# Patient Record
Sex: Female | Born: 1935 | Race: White | Hispanic: No | Smoking: Never smoker
Health system: Southern US, Community
[De-identification: ages and names within clinical notes are randomized; demographics above are authoritative.]

## PROBLEM LIST (undated history)

## (undated) DIAGNOSIS — E119 Type 2 diabetes mellitus without complications: Secondary | ICD-10-CM

## (undated) DIAGNOSIS — R011 Cardiac murmur, unspecified: Secondary | ICD-10-CM

## (undated) DIAGNOSIS — I10 Essential (primary) hypertension: Secondary | ICD-10-CM

## (undated) DIAGNOSIS — M810 Age-related osteoporosis without current pathological fracture: Secondary | ICD-10-CM

## (undated) DIAGNOSIS — H269 Unspecified cataract: Secondary | ICD-10-CM

## (undated) DIAGNOSIS — M1990 Unspecified osteoarthritis, unspecified site: Secondary | ICD-10-CM

## (undated) DIAGNOSIS — I219 Acute myocardial infarction, unspecified: Secondary | ICD-10-CM

## (undated) HISTORY — DX: Type 2 diabetes mellitus without complications: E11.9

## (undated) HISTORY — DX: Cardiac murmur, unspecified: R01.1

## (undated) HISTORY — DX: Age-related osteoporosis without current pathological fracture: M81.0

## (undated) HISTORY — DX: Acute myocardial infarction, unspecified: I21.9

## (undated) HISTORY — PX: EYE SURGERY: SHX253

## (undated) HISTORY — DX: Essential (primary) hypertension: I10

## (undated) HISTORY — PX: APPENDECTOMY: SHX54

## (undated) HISTORY — PX: ABDOMINAL HYSTERECTOMY: SHX81

## (undated) HISTORY — DX: Unspecified cataract: H26.9

---

## 1997-10-13 ENCOUNTER — Other Ambulatory Visit: Admission: RE | Admit: 1997-10-13 | Discharge: 1997-10-13 | Payer: Self-pay | Admitting: *Deleted

## 1997-11-07 ENCOUNTER — Ambulatory Visit (HOSPITAL_COMMUNITY): Admission: RE | Admit: 1997-11-07 | Discharge: 1997-11-07 | Payer: Self-pay | Admitting: *Deleted

## 1997-11-10 ENCOUNTER — Other Ambulatory Visit: Admission: RE | Admit: 1997-11-10 | Discharge: 1997-11-10 | Payer: Self-pay | Admitting: *Deleted

## 1997-12-01 ENCOUNTER — Ambulatory Visit (HOSPITAL_COMMUNITY): Admission: RE | Admit: 1997-12-01 | Discharge: 1997-12-01 | Payer: Self-pay | Admitting: Gastroenterology

## 1998-11-06 ENCOUNTER — Other Ambulatory Visit: Admission: RE | Admit: 1998-11-06 | Discharge: 1998-11-06 | Payer: Self-pay | Admitting: *Deleted

## 1999-07-14 ENCOUNTER — Emergency Department (HOSPITAL_COMMUNITY): Admission: EM | Admit: 1999-07-14 | Discharge: 1999-07-14 | Payer: Self-pay | Admitting: Emergency Medicine

## 1999-07-14 ENCOUNTER — Encounter: Payer: Self-pay | Admitting: Emergency Medicine

## 1999-10-18 ENCOUNTER — Encounter: Admission: RE | Admit: 1999-10-18 | Discharge: 1999-10-18 | Payer: Self-pay | Admitting: *Deleted

## 1999-11-12 ENCOUNTER — Other Ambulatory Visit: Admission: RE | Admit: 1999-11-12 | Discharge: 1999-11-12 | Payer: Self-pay | Admitting: *Deleted

## 1999-12-20 ENCOUNTER — Other Ambulatory Visit: Admission: RE | Admit: 1999-12-20 | Discharge: 1999-12-20 | Payer: Self-pay | Admitting: *Deleted

## 2000-12-08 ENCOUNTER — Encounter: Payer: Self-pay | Admitting: Internal Medicine

## 2000-12-08 ENCOUNTER — Encounter: Admission: RE | Admit: 2000-12-08 | Discharge: 2000-12-08 | Payer: Self-pay | Admitting: Internal Medicine

## 2000-12-23 ENCOUNTER — Other Ambulatory Visit: Admission: RE | Admit: 2000-12-23 | Discharge: 2000-12-23 | Payer: Self-pay | Admitting: Gynecology

## 2001-02-12 ENCOUNTER — Encounter: Payer: Self-pay | Admitting: Obstetrics and Gynecology

## 2001-02-18 ENCOUNTER — Inpatient Hospital Stay (HOSPITAL_COMMUNITY): Admission: RE | Admit: 2001-02-18 | Discharge: 2001-02-21 | Payer: Self-pay | Admitting: Obstetrics and Gynecology

## 2001-02-18 ENCOUNTER — Encounter (INDEPENDENT_AMBULATORY_CARE_PROVIDER_SITE_OTHER): Payer: Self-pay | Admitting: Specialist

## 2001-03-04 ENCOUNTER — Encounter: Admission: RE | Admit: 2001-03-04 | Discharge: 2001-03-04 | Payer: Self-pay | Admitting: *Deleted

## 2001-06-04 ENCOUNTER — Ambulatory Visit (HOSPITAL_COMMUNITY): Admission: RE | Admit: 2001-06-04 | Discharge: 2001-06-04 | Payer: Self-pay | Admitting: *Deleted

## 2002-01-04 ENCOUNTER — Encounter: Admission: RE | Admit: 2002-01-04 | Discharge: 2002-01-04 | Payer: Self-pay | Admitting: Internal Medicine

## 2002-01-07 ENCOUNTER — Encounter: Admission: RE | Admit: 2002-01-07 | Discharge: 2002-01-07 | Payer: Self-pay | Admitting: Internal Medicine

## 2002-03-24 ENCOUNTER — Other Ambulatory Visit: Admission: RE | Admit: 2002-03-24 | Discharge: 2002-03-24 | Payer: Self-pay | Admitting: Obstetrics and Gynecology

## 2002-06-23 ENCOUNTER — Emergency Department (HOSPITAL_COMMUNITY): Admission: EM | Admit: 2002-06-23 | Discharge: 2002-06-23 | Payer: Self-pay | Admitting: Emergency Medicine

## 2003-04-26 ENCOUNTER — Encounter: Admission: RE | Admit: 2003-04-26 | Discharge: 2003-04-26 | Payer: Self-pay | Admitting: Internal Medicine

## 2004-06-06 ENCOUNTER — Ambulatory Visit (HOSPITAL_COMMUNITY): Admission: RE | Admit: 2004-06-06 | Discharge: 2004-06-06 | Payer: Self-pay | Admitting: Internal Medicine

## 2004-06-25 ENCOUNTER — Ambulatory Visit (HOSPITAL_COMMUNITY): Admission: RE | Admit: 2004-06-25 | Discharge: 2004-06-25 | Payer: Self-pay | Admitting: Interventional Radiology

## 2004-06-25 ENCOUNTER — Encounter (INDEPENDENT_AMBULATORY_CARE_PROVIDER_SITE_OTHER): Payer: Self-pay | Admitting: *Deleted

## 2004-07-11 ENCOUNTER — Encounter: Payer: Self-pay | Admitting: Interventional Radiology

## 2005-08-31 IMAGING — XA IR KYPHOPLASTY/VERTERBROPLASTY
1 series · 15 of 24 positions shown · IV contrast (agent unspecified)
Comparison: none

CLINICAL DATA: Patient with severe low back pain secondary to subacute compression fractures at L3 and L5.
KYPHOPLASTY AT L3 AND VERTEBROPLASTY AT L5:
Following a full explanation of the procedure along with the potentially associated complications, an informed witnessed consent was obtained for kyphoplasty at L3 and vertebroplasty at L5.  
KYPHOPLASTY AT L3:
The skin overlying the lumbar region was then prepped and draped in the usual sterile fashion.  The L3 vertebral body was brought into optimal view.
The right pedicle was identified and infiltrated with 0.25 percent bupivacaine.  
Using biplane intermittent fluoroscopy, an 11-gauge Jamshidi needle was advanced through the right pedicle into the posterior one-third at L3.  Over a KyphX osteo bone pin, this was exchanged for the KyphX advanced osteo introducer system comprised of a working cannula and a KyphX osteo drill.  
This combination was then advanced until the tip of the KyphX osteo drill was in the posterior third at L3.  
The bone pin was then removed.  In a medial trajectory, using biplane intermittent fluoroscopy, the combination was advanced until the tip of the working cannula was inside the posterior third at L3. 
The KyphX osteo drill was removed and a core sample sent for pathologic analysis.
Through the working cannula, a KyphX bone biopsy device was then advanced to within 5 mm from the anterior aspect of L3.  A core sample from this was also sent for pathologic analysis. 
Through the working cannula, a KyphX inflatable bone tamp 20 x 3 was then advanced and positioned in the vertebral body with the distal marker 5 mm from the anterior aspect of L3.  This balloon was then inflated with contrast using a Kyphon inflation syringe device via micro tubing. The inflation was continued until the end point of apposition with the superior end plate was met.  Crossing of the midline by the balloon was noted.  The methylmethacrylate mixture was then reconstituted with Tobramycin in the Kyphon bone mixing device system.    This was then loaded onto the KyphX bone fillers.
The balloon was deflated followed by the instillation of 3 bone filler equivalents of methylmethacrylate mixture.  
There was no extravasation noted into the disc spaces or posteriorly into the spinal canal.    The patient tolerated the procedure well. 
The working cannula was the removed, as was the bone filler device.
The patient tolerated the procedure well.  There were no acute complications.

[Series 1: run · 15 of 26 slices shown]
[im 1/26]
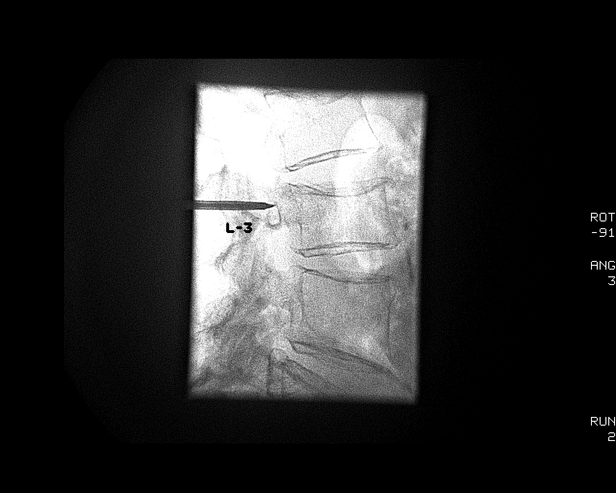
[im 3/26]
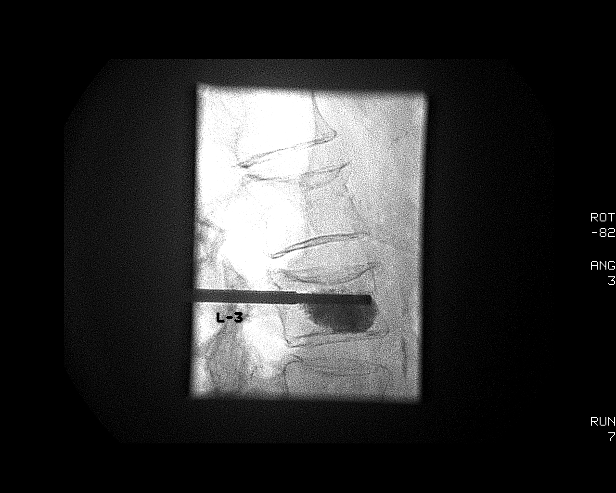
[im 5/26]
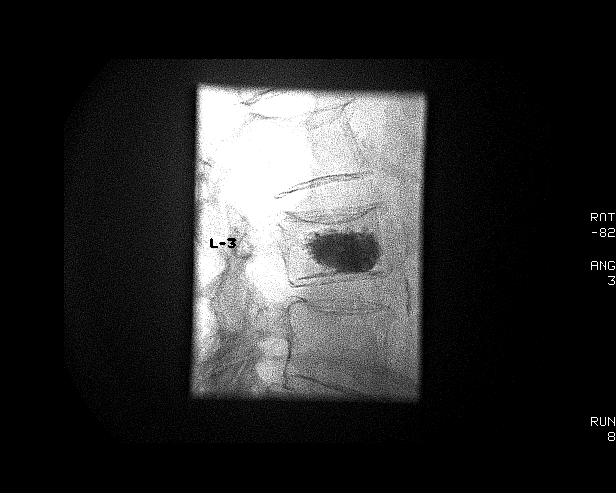
[im 6/26]
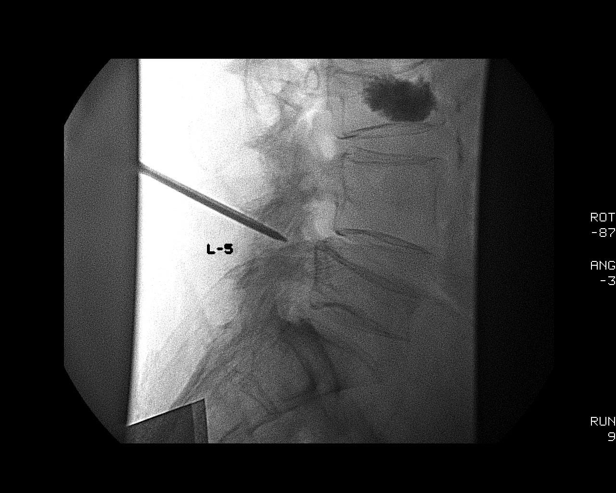
[im 8/26]
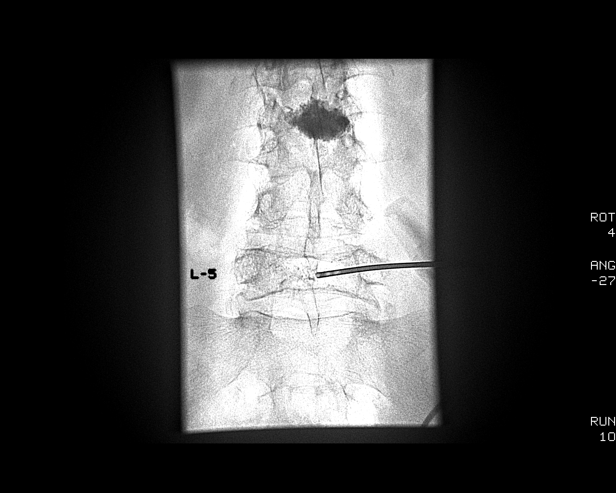
[im 9/26]
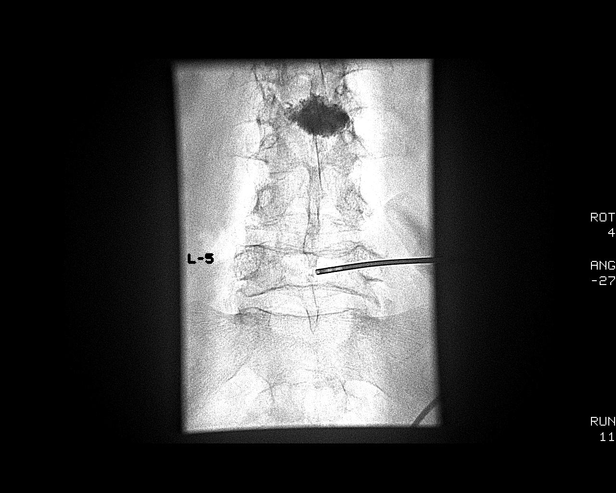
[im 11/26]
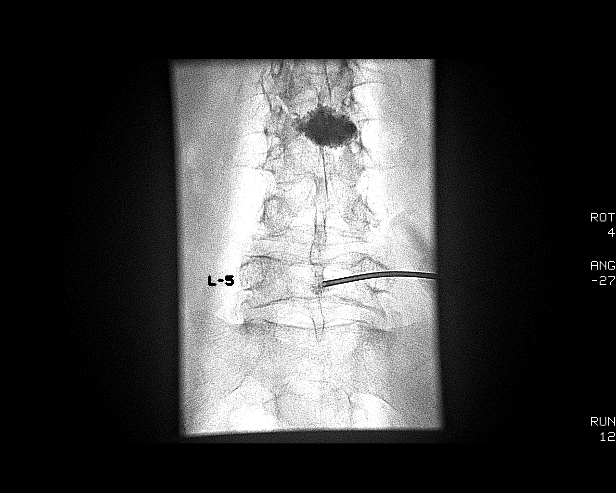
[im 14/26]
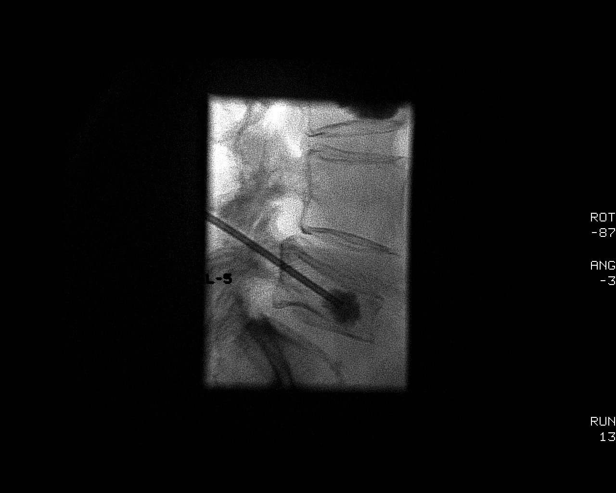
[im 15/26]
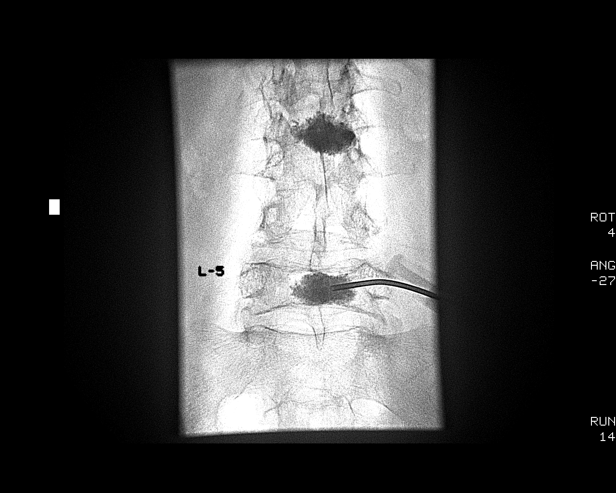
[im 17/26]
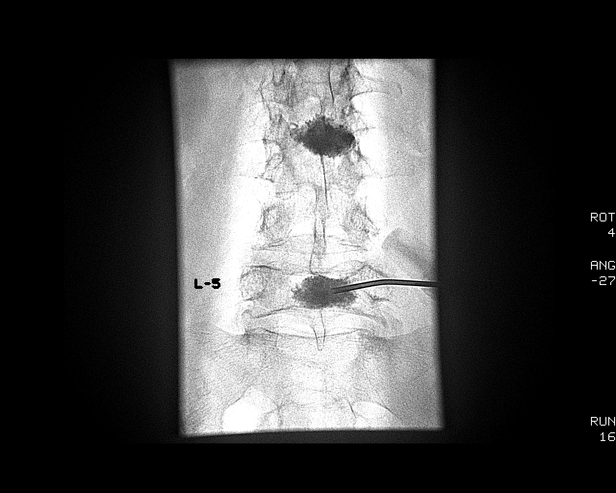
[im 18/26]
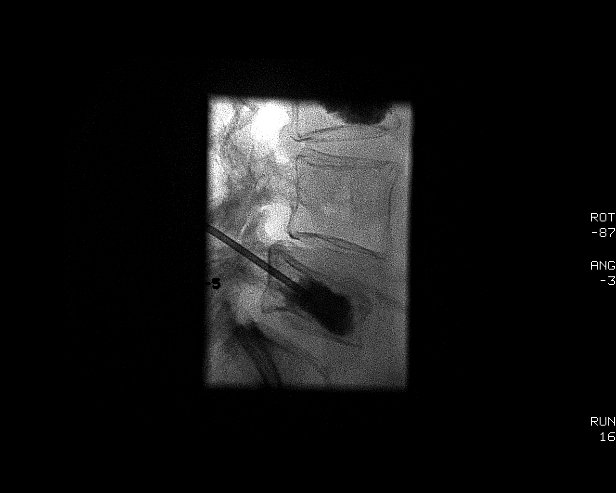
[im 20/26  full-range]
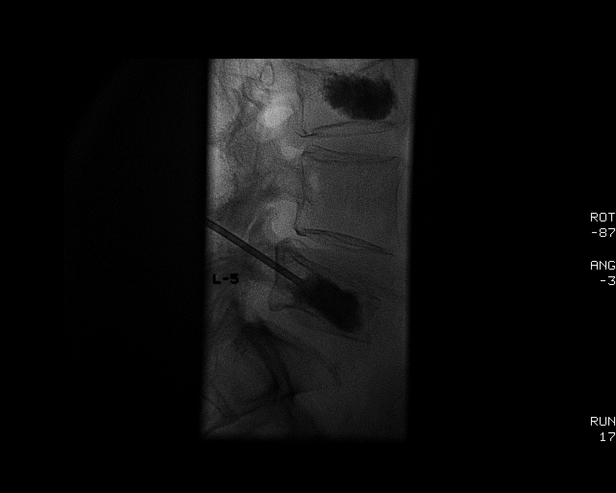
[im 22/26  full-range]
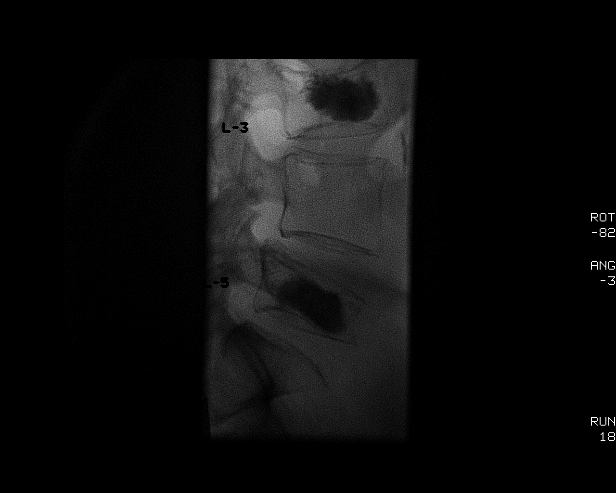
[im 23/26]
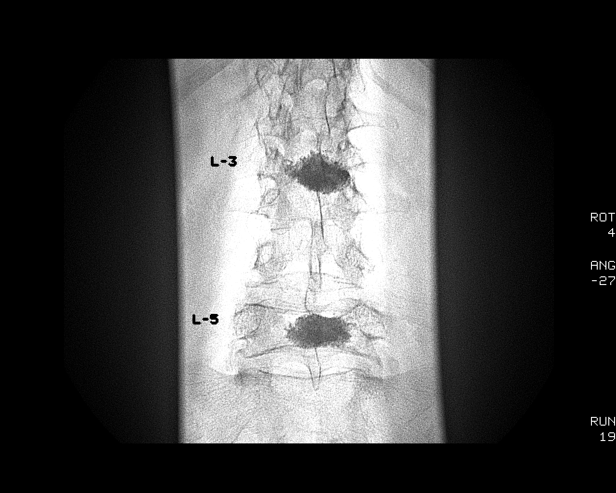
[im 26/26]
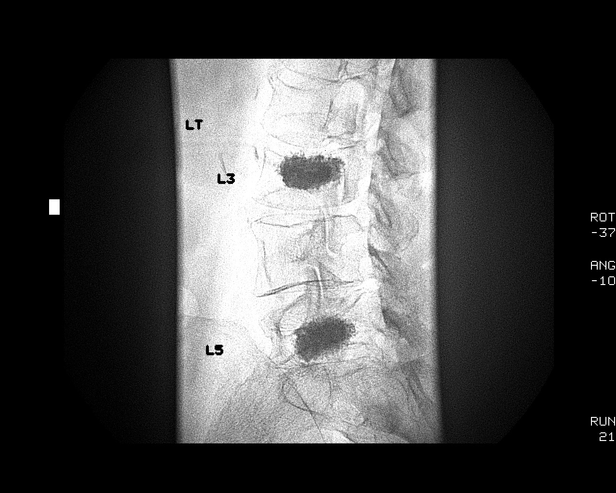

[15 of 24 positions shown; findings below may reference images not displayed]

IMPRESSION: Status post fluoroscopic-guided deep core bone biopsy at L3 followed by closed reduction with internal fixation of painful compression deformity at L3 using the kyphoplasty technique most likely osteoporotic.
VERTEBROPLASTY AT L5:
Initial attempt at performing a kyphoplasty through the right transpedicular approach at L5 was met with significant resistance to the passage of the 11-gauge Jamshidi needle through the pedicle.
After application of local anesthetic to the right pedicle, an 11-gauge Jamshidi needle was advanced through the right pedicle and positioned in the posterior third right next to the pedicle at L5.  Exchange over a bone pin for the Kyphon advanced osteo introducer system comprised of a working cannula and a KyphX drill was met with significant resistance with the patient being symptomatic and also the bone being really hard in its pedicle region.  This prompted removal of the system, although a core was obtained for biopsy.
At this time, a 13-gauge Ronlor spinal needle was advanced through the right pedicle and positioned in the anterior one-third of L5.  A gentle contrast injection demonstrated a trabecular pattern of contrast.
Methylmethacrylate mixture was then reconstituted with Tobramycin in the Benites delivery device system.
Using biplane intermittent fluoroscopy, methylmethacrylate mixture was instilled into the L5 vertebral body with excellent filling in the AP and lateral projections. 
No extravasation was noted into the superior end plate or posteriorly into the spinal canal.   The patient tolerated the procedure well. There were no acute complications.
The needle was removed and hemostasis was achieved.
IMPRESSION: Status post needle placement for vertebroplasty at L5 for painful subacute compression fracture.  
Medications for both procedures:  3.5 mg Versed and 100 micrograms Fentanyl IV.

## 2005-10-07 ENCOUNTER — Emergency Department (HOSPITAL_COMMUNITY): Admission: EM | Admit: 2005-10-07 | Discharge: 2005-10-07 | Payer: Self-pay | Admitting: Emergency Medicine

## 2006-06-11 ENCOUNTER — Encounter: Admission: RE | Admit: 2006-06-11 | Discharge: 2006-06-11 | Payer: Self-pay | Admitting: Internal Medicine

## 2006-06-18 ENCOUNTER — Encounter: Payer: Self-pay | Admitting: Internal Medicine

## 2006-06-20 ENCOUNTER — Encounter (INDEPENDENT_AMBULATORY_CARE_PROVIDER_SITE_OTHER): Payer: Self-pay | Admitting: Specialist

## 2006-06-20 ENCOUNTER — Ambulatory Visit (HOSPITAL_COMMUNITY): Admission: RE | Admit: 2006-06-20 | Discharge: 2006-06-20 | Payer: Self-pay | Admitting: Interventional Radiology

## 2007-07-07 ENCOUNTER — Encounter: Admission: RE | Admit: 2007-07-07 | Discharge: 2007-10-06 | Payer: Self-pay | Admitting: Internal Medicine

## 2007-09-30 ENCOUNTER — Encounter: Admission: RE | Admit: 2007-09-30 | Discharge: 2007-09-30 | Payer: Self-pay | Admitting: Internal Medicine

## 2007-10-07 ENCOUNTER — Encounter: Admission: RE | Admit: 2007-10-07 | Discharge: 2008-01-05 | Payer: Self-pay | Admitting: Internal Medicine

## 2009-02-07 ENCOUNTER — Emergency Department (HOSPITAL_COMMUNITY): Admission: EM | Admit: 2009-02-07 | Discharge: 2009-02-07 | Payer: Self-pay | Admitting: Emergency Medicine

## 2009-02-19 ENCOUNTER — Emergency Department (HOSPITAL_COMMUNITY): Admission: EM | Admit: 2009-02-19 | Discharge: 2009-02-19 | Payer: Self-pay | Admitting: Family Medicine

## 2009-06-03 ENCOUNTER — Emergency Department (HOSPITAL_COMMUNITY): Admission: EM | Admit: 2009-06-03 | Discharge: 2009-06-03 | Payer: Self-pay | Admitting: Emergency Medicine

## 2010-03-13 ENCOUNTER — Encounter
Admission: RE | Admit: 2010-03-13 | Discharge: 2010-05-09 | Payer: Self-pay | Source: Home / Self Care | Attending: Internal Medicine | Admitting: Internal Medicine

## 2010-10-05 NOTE — Consult Note (Signed)
Leslie Delgado, LEIB                ACCOUNT NO.:  [id number]   MEDICAL RECORD NO.:  [id number]          PATIENT TYPE:  OUT   LOCATION:  XRAY                         FACILITY:  [facility]   PHYSICIAN:  Sanjeev K. Deveshwar, M.D.DATE OF BIRTH:  September 10, 1921   DATE OF CONSULTATION:  DATE OF DISCHARGE:                                 CONSULTATION   DATE OF CONSULTATION:  June 18, 2006   CHIEF COMPLAINT:  Compression fracture.   HISTORY OF PRESENT ILLNESS:  This is a pleasant 77 year old female with  a history of osteoporosis and previous history of compression fractures.  The patient has been referred to Dr. Corliss Skains through the courtesy of  Dr. Juline Patch, her primary care physician.  Approximately 3 weeks ago  the patient bent over to pick something up off the floor at which time  she developed severe back pain.  The patient has been taking Vicodin for  pain however she continues to be very debilitated by her pain.  She had  an MRI on June 11, 2006 that showed a T10 fracture with 60% loss of  height.  There was also an N-plate fracture at L1 with a 10-20% loss of  height.  The patient presents today accompanied by her daughter to  discuss further treatment options with Dr. Corliss Skains.   PAST MEDICAL HISTORY:  1. The patient had an L3 kyphoplasty as well as L5 vertebroplasty      performed by Dr. Corliss Skains in February of 2006.  2. She fell and injured her shoulder in early November and this is      being followed by Dr. Simonne Come.  3. She has a history of diabetes mellitus.  4. Hyperlipidemia.  5. Hypertension.  6. Gastroesophageal reflux disease.  7. Seasonal allergies.  8. Migraine headaches.  9. History of a carotid bruits.   PAST SURGICAL HISTORY:  Significant for:  1. Hysterectomy.  2. Tonsillectomy.  3. Previous surgery for detached retina x3 as well as cataract      surgery.   ALLERGIES:  SHE IS ALLERGIC TO PENICILLIN, SULFA AND LOTENSIN.   MEDICATIONS:  1.  Vicodin for pain.  2. Calcium 600 mg daily.  3. Vitamin E 400 mg daily.  4. Multivitamin Feverfew b.i.d.  5. Aciphex 20 mg daily.  6. Glyburide 5/500 one a.m., two p.m.  7. Lasix p.r.n.  8. Zoloft, dosage not available.  9. Lotrel 5/20 one daily.  10.Wellbutrin 150 mg daily.  11.Januvia one daily.  12.Alpha lipoic acid 600 mg daily.  13.Tramadol p.r.n.   SOCIAL HISTORY:  Patient is married, she has 3 children, she lives in  [place] with her husband.  Her husband is disabled due to Parkinson's  disease.  The patient has never used tobacco, she does not use alcohol.  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   FAMILY HISTORY:  Mother died at age 46, she had severe peripheral  vascular disease.  Her father died in his 63s.  He had a long history of  tobacco use and history of CVAs.  IMPRESSION:  Dr. Corliss Skains reviewed the results of the patient's most  recent MRI with the patient and her daughter.  He pointed out the new  cmopression fractures as well as the previously treated compression  fractures.  He felt that she would be a candidate for a kyphoplasty or  vertebroplasty at both levels.  The procedure was again described in  detail along with risks and benefits.  The patient is anxious to proceed  for stabilization of her fractures and relief of pain.  She has been  tentatively scheduled for Friday June 20, 2006, time to be announced.   Greater than 30 minutes has been spent on this consult.      Delton See, P.A.    ______________________________  Grandville Silos. Corliss Skains, M.D.    DR/MEDQ  D:  06/18/2006  T:  06/19/2006  Job:  [id number]   cc:   Juline Patch, M.D.  Marlowe Kays, M.D.

## 2010-10-05 NOTE — Procedures (Signed)
[facility]  Patient:    Leslie Delgado, Leslie Delgado Visit Number: [id number] MRN: [id number]          Service Type: END Location: ENDO Attending Physician:  Sabino Gasser Dictated by:   Sabino Gasser, M.D. Admit Date:  06/04/2001 Discharge Date: 06/04/2001                             Procedure Report  PROCEDURE:  Upper endoscopy.  ENDOSCOPIST:  Sabino Gasser, M.D.  INDICATIONS:  Dysphagia, GERD.  ANESTHESIA:  Demerol 50, Versed 6 mg.  DESCRIPTION OF PROCEDURE:  With the patient mildly sedated in the left lateral decubitus position the Olympus videoscopic endoscope was inserted in the mouth and passed under direct vision through the esophagus which appeared normal into the stomach.  The fundus, body, antrum, duodenal bulb, second portion of the duodenum all appeared normal.  From this point, the endoscope was slowly withdrawn taking circumferential views of the entire duodenal mucosa until the endoscope then pulled back into the stomach, placed on retroflexion and viewed the stomach from below.  It was noted that the GE junction made a very loose wrap around the endoscope.  This was photographed.  The endoscope was then straightened and withdrawn, taking circumferential views of the remaining gastric and esophageal mucosa.  The hiatal hernia was visualized from above, and no Barretts esophagus seen.  The patients vital signs and pulse oximeter remained stable.  The patient tolerated the procedure well without apparent complications.  FINDINGS:  Small hiatal hernia, otherwise unremarkable examination.  PLAN:  Continue Aciphex, at this point, since that patients symptoms have all abated on medication. Dictated by:   Sabino Gasser, M.D. Attending Physician:  Sabino Gasser DD:  06/04/01 TD:  06/06/01 Job: [id number]

## 2010-10-05 NOTE — H&P (Signed)
[facility]  Patient:    Leslie Delgado, Leslie Delgado Visit Number: [id number] MRN: [id number]          Service Type: Attending:  Rande Brunt. Eda Paschal, M.D.                           History and Physical  CHIEF COMPLAINT:              Pelvic mass and symptomatic cystocele.  HISTORY OF PRESENT ILLNESS:   Patient is a 77 year old gravida 3, para 3, AB 0 who was seen in my office on August 06th because of a third degree cystocele. Patients symptoms included the fact that she could feel a large protuberance coming outside of her vagina.  She had urgency incontinence.  She had recurrent urinary tract infection.  She had hesitancy.  She was having trouble completely emptying her bladder.  She has had a workup by Dr. Earlene Plater who did not feel sling procedure would help along with cystocele reduction.  She did have neurourodynamic evaluation.  The other problem was that when she was seen in the office a large pelvic mass of approximately 10 cm was felt.  It was most consistent with a myoma.  Ultrasound revealed a 16.5 x 10.1 x 10.8 cm homogeneous mass in the pelvis.  It is very difficult to be sure whether it is uterine or adnexal.  She had never had anyone feel this mass previously.  As a result of the above she now enters the hospital for exploratory laparotomy with total abdominal hysterectomy and bilateral salpingo-oophorectomy. Assuming that the mass is benign we will then proceed with an anterior and possibly posterior repair for correction of the rest of her symptomatic pelvic relaxation.  She also has uterine prolapse in spite of this large mass.  If she has a malignancy Dr. Stanford Breed is on standby to do appropriate other procedures.  PAST MEDICAL HISTORY:         Patient has a history of diabetes, hypertension, arthritis, bronchitis, migraine headaches, and hypercholesterolemia.  She was recently evaluated by Lilia Pro who felt that she was low risk for  surgery and gave permission for her to proceed with surgery.  PRESENT MEDICATIONS:          Include a baby aspirin, which she stopped prior to surgery, multivitamins, calcium, Diabeta 5 mg b.i.d., Glucophage 500 mg t.i.d., Norvasc 10 mg daily, Lasix 40 mg on a p.r.n. basis for edema, Urised 1 or 2 tablets p.r.n. bladder irritability, niacin 250 mg daily, Endal HD p.r.n. cough, __________ everyday for migraine prophylaxis, Aleve p.r.n. for acute migraine, Claritin 10 mg daily, and Zoloft 100 mg tablet half a tablet daily.   ALLERGIES:                    She is allergic to LOTENSIN, PENICILLIN, CODEINE and SULFA.  FAMILY HISTORY:               Father died at age 23 with heart disease and a stroke.  Mother died at age 30 with ASPVD requiring an amputation and also had a history of cancerous colon polyp.  Patient has two brothers; one brother had an angioplasty as well as bilateral carotid endarterectomies.  There is also a family history of CAD, glaucoma and high cholesterol.  The other brother is in good health.  There is also a positive family history for stomach cancer in an uncle and breast cancer in  two aunts and a couple cousins.  His of GI cancer in an aunt, multiple colon polyps in her brother and diabetes in an uncle.  SOCIAL HISTORY:               Patient is married with three children who are in good health.  She does not work.  Her husband is retired.  She occasionally exercises.  She has never been a smoker and does not drink alcohol.  REVIEW OF SYSTEMS:            Except for problems discussed above the patient reports no other complaints referable to head, eyes, ears, nose, throat, neck, chest, heart, lungs, breasts, GI tract, GU tract, musculoskeletal system, neurological system, endocrine system, and skin.  PHYSICAL EXAMINATION:  GENERAL APPEARANCE:           Patient is a well-developed, well-nourished female in no acute distress.  VITAL SIGNS:                   Blood pressure is 118/62, pulse is 80 and regular, respirations 16 and nonlabored, and she is afebrile.  HEENT:                        Within normal limits.  NECK:                         Supple.  Trachea in the midline.  Thyroid is not enlarged.  LUNGS:                        Clear to P&A.  BREASTS:                      No masses.  HEART:                        No thrills, heaves or murmurs.  ABDOMEN:                      Soft without guarding, rebound or masses.  PELVIC:                       External genitalia is normal.  BUS is normal. Vaginal examination shows an exceedingly large third degree cystocele.  Cervix is clean.  Pap smear shows no atypia.  The patient has second degree uterine descensus, but also has a large 10-16 cm pelvic mass, which is felt to probably be a myoma.  Adnexa difficult to assess because of large mass. Rectovaginal is confirmatory.  EXTREMITIES:                  Within normal limits.  ADMISSION IMPRESSION:         1. Symptomatic cystocele.                               2. Uterine prolapse.                               3. Large pelvic mass.                               4. Diabetes.  5. Hypertension.                               6. Arthritis.                               7. Hypercholesterolemia.  PLAN:                         Exploratory laparotomy with TAH-BSO; other abdominal surgery as appropriate followed by anterior and probable posterior repair. Attending:  Rande Brunt. Eda Paschal, M.D. DD:  02/17/01 TD:  02/17/01 Job: [id number]

## 2010-10-05 NOTE — Discharge Summary (Signed)
[facility]  Patient:    WRENLEY, SAYED Visit Number: [id number] MRN: [id number]          Service Type: GYN Location: 4W 0444 01 Attending Physician:  Sharon Mt Dictated by:   Rande Brunt. Eda Paschal, M.D. Admit Date:  02/18/2001 Discharge Date: 02/21/2001                             Discharge Summary  HISTORY OF PRESENT ILLNESS/HOSPITAL COURSE:  Patient is a 77 year old female who was admitted to the hospital with a large pelvic mass as well as uterine prolapse, cystocele and rectocele for definitive surgery.  On the day of admission, she was taken to the operating room.  Exploratory laparotomy was done, findings were a large left ovarian cyst, and the patient first underwent a total abdominal hysterectomy and bilateral salpingo-oophorectomy.  Frozen section done on the ovarian cyst came back a benign ovarian fibroma as well as a benign serous cystadenoma.  The lesion clearly had two different components, both a solid and a cystic, to explain the above.  There was also a cyst on her right ovary and a frozen on that also was benign.  As a result of a benign report after the TAH-BSO was accomplished, the patient was repositioned and an anterior and posterior repair was done because of her cystocele and rectocele. Postoperatively, the patient continued to do well.  She, however, had a mild ileus that responded both to IV fluids and ambulation.  She also found that, when she took her Foley out, she was unable to void.  By the third postoperative day, however, she was ready for discharge in terms of the ileus and she was discharged with her Foley in place, to return to the office to have her Foley removed on October 9.  She was discharged on Macrobid b.i.d. for GU suppression.  She was also discharged on Darvocet-N 100 for pain.  Diet on discharge was regular.  Activity on discharge was ambulatory.  Wound care was routine, with her to return  to the office on October 9 for staple removal.  Condition on discharge was improved.  Final pathology report revealed left ovary and fallopian tube with benign ovarian fibroma and benign serous cystadenoma, right ovary and fallopian tube with benign serous cystadenoma, uterus with leiomyoma uteri, both submucosal and intramural, endometrium with cystic atrophy with focal simple hyperplasia without atypia.  DISCHARGE DIAGNOSES: 1. Bilateral serous cystadenomas of the ovaries. 2. Benign left ovarian fibroma. 3. Leiomyoma uteri. 4. Uterine prolapse. 5. Cystocele and rectocele.  OPERATIONS:  Total abdominal hysterectomy and bilateral salpingo-oophorectomy, anterior and posterior repair. Dictated by:   Rande Brunt. Eda Paschal, M.D. Attending Physician:  Sharon Mt DD:  03/13/01 TD:  03/14/01 Job: [id number]

## 2010-10-05 NOTE — Op Note (Signed)
[facility]  Patient:    Leslie Delgado, Leslie Delgado Visit Number: [id number] MRN: [id number]          Service Type: GYN Location: 4W 0444 01 Attending Physician:  Sharon Mt Dictated by:   Rande Brunt. Eda Paschal, M.D. Proc. Date: 02/18/01 Admit Date:  02/18/2001                             Operative Report  PREOPERATIVE DIAGNOSIS:  Large pelvic mass, cystocele, rectocele, and uterine prolapse.  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  OPERATION:  Total abdominal hysterectomy and bilateral salpingo-oophorectomy followed by anterior and posterior repair.  SURGEON:  Daniel L. Eda Paschal, M.D.  FIRST ASSISTANT:  Katy Fitch, M.D.  ANESTHESIA:  General endotracheal.  FINDINGS:  At the time of laparotomy, the patients left ovary was exceedingly large, in the neighborhood of about 16 cm.  It was enlarged by ovarian neoplasm.  The ovarian neoplasm had been solid and cystic components.  At frozen section, the solid component was a benign fibroma, and the cystic component was either a serous cystadenoma or a very old endometrioma.  It was somewhat adherent both to the uterus and to the cul-de-sac.  It had blebs on the surface of it, but they were not true excrescences.  On her right ovary, there was a small, less than 1 cm lesion coming off of it, and then on frozen section was benign.  The uterus itself was normal size and shape.  The patient had a 30 degree cystocele and a second degree rectocele as well as second degree uterine descensus.  DESCRIPTION OF PROCEDURE:  After adequate general endotracheal anesthesia, the patient was placed in [place] stirrups, prepped and draped in the usual sterile manner.  A midline vertical incision was made.  It was then extended around the umbilicus for further exposure.  It was carried down to the fascia and the peritoneum.  Subcutaneous bleeders were  clamped and Bovied as encountered. When the peritoneal cavity was opened, the above findings were noted. Peritoneal washings were obtained.  After this, the left adnexal mass was carefully dissected free.  The retroperitoneal space could be opened.  The ureter could be identified.  The infundibulopelvic ligament was clamped, cut, and doubly suture ligated with #1 chromic catgut.  The tumor could be separated from the uterus.  It could then be separated from the peritoneum where it was adherent by sharp and blunt dissection.  All this was done without rupturing the cyst.  It was sent for frozen section and came back benign.  It was a benign fibroma as well as a benign serous cystadenoma.  The left ovary also was sent for frozen and came back benign.  The left ovary was removed by bipolaring and cutting the round ligament, opening the peritoneum, identifying the ureter, and then clamping, cutting, and double suture ligating the infundibulopelvic ligament.  After the adnexa had been removed, the surgeon went ahead and did a hysterectomy.  The vesicouterine fold of the peritoneum and posterior peritoneum were sharply dissected free.  The uterine arteries were clamped, cut, and doubly suture ligated with #1 chromic catgut, and then the parametrium was taken down in successive bites by clamping, cutting, and suture ligating with #1 chromic catgut.  The cervicovaginal junction was identified, entered with sharp dissection, and the uterus was also sent to pathology for tissue diagnosis.  Angle sutures were  placed in the angles of the vagina, incorporating the uterosacral ligaments of the parametrium for good vault support.  These were #1 chromic catgut.  The vaginal cuff was closed with figure-of-eights of 0 Vicryl.  A modified Moschcowitz enterocele prevention suture was placed, keeping the ureters identified the entire time. This suture at the sigmoid colon into the area between the  uterosacral ligaments, and would prevent an enterocele in the future.  Copious irrigation was done with Ringers lactate.  The sponge, needle, and instrument counts were correct.  The peritoneum and the fascia was closed in a single layer using a double band of 0 PDS.  One was started above the umbilicus and was started in the pelvis, and they met and were tied together in the midline.  Copious irrigation was done with Ringers lactate and the skin was closed with staples.  The patient was then repositioned.  Her very large cystocele that had been seen at the beginning of the procedure was still present, but it obviously looked much different without ________ of the 16 cm mass.  Now, it appeared almost as if there was a lot of redundant vaginal tissue more than a cystocele.  Starting at the cuff, dissection was taken all the way to the top of the urethrovesical angle.  Her Foley catheter which had been placed at the beginning of the procedure was kept in place.  The perivesical fascia was sharply dissected free.  There really was not much of a cystocele, as much of it was just a lot of redundant vaginal mucosa.  The vesicle fascia was brought together in the midline to eliminate whatever cystocele was present with interrupted 2-0 Vicryl.  Eight to 10 sutures of this were done.  Following this, the anterior vaginal mucosa was closed with a running 2-0 Vicryl, incorporating the tissue underneath to eliminate any dead space. At this point, the patient had no cystocele.  Attention was then turned to the posterior repair.  The posterior vaginal mucosa was undermined from the introitus all the way to the top of the cuff. The patient did have a very large rectocele.  It was bluntly and sharply dissected free from the vaginal mucosa.  The patient had good perirectal fascia.  This was brought together in the midline to eliminate the rectocele with interrupted 2-0 Vicryl.  Redundant posterior  vaginal mucosa was trimmed away, and then the posterior vaginal mucosa was then closed with a running locking 2-0 Vicryl, once again bringing up the perirectal fascia to eliminate  any dead space.  At this point, the patient had an excellent result.  One inch Iodoform was then placed in vagina to pack the vagina.  Estimated blood loss for the entire procedure was 350 cc with none replaced.  The patient left the operating room in satisfactory condition, draining clear urine from her Foley catheter. Dictated by:   Rande Brunt. Eda Paschal, M.D. Attending Physician:  Sharon Mt DD:  02/18/01 TD:  02/19/01 Job: [id number]

## 2010-11-04 ENCOUNTER — Observation Stay (HOSPITAL_COMMUNITY)
Admission: EM | Admit: 2010-11-04 | Discharge: 2010-11-05 | Disposition: A | Discharge status: Home / Self Care | Payer: Medicare Other | Attending: Internal Medicine | Admitting: Internal Medicine

## 2010-11-04 ENCOUNTER — Emergency Department (HOSPITAL_COMMUNITY): Payer: Medicare Other

## 2010-11-04 DIAGNOSIS — E119 Type 2 diabetes mellitus without complications: Secondary | ICD-10-CM | POA: Insufficient documentation

## 2010-11-04 DIAGNOSIS — R209 Unspecified disturbances of skin sensation: Secondary | ICD-10-CM | POA: Insufficient documentation

## 2010-11-04 DIAGNOSIS — Z79899 Other long term (current) drug therapy: Secondary | ICD-10-CM | POA: Insufficient documentation

## 2010-11-04 DIAGNOSIS — R0789 Other chest pain: Principal | ICD-10-CM | POA: Insufficient documentation

## 2010-11-04 DIAGNOSIS — I10 Essential (primary) hypertension: Secondary | ICD-10-CM | POA: Insufficient documentation

## 2010-11-04 LAB — DIFFERENTIAL
Basophils Absolute: 0 10*3/uL (ref 0.0–0.1)
Lymphocytes Relative: 21 % (ref 12–46)
Lymphs Abs: 1.6 10*3/uL (ref 0.7–4.0)
Neutro Abs: 5.1 10*3/uL (ref 1.7–7.7)

## 2010-11-04 LAB — CBC
HCT: 36.5 % (ref 36.0–46.0)
Hemoglobin: 12.3 g/dL (ref 12.0–15.0)
RBC: 4.15 MIL/uL (ref 3.87–5.11)
WBC: 7.7 10*3/uL (ref 4.0–10.5)

## 2010-11-04 LAB — POCT I-STAT, CHEM 8
Chloride: 105 mEq/L (ref 96–112)
Creatinine, Ser: 1.1 mg/dL (ref 0.50–1.10)
HCT: 37 % (ref 36.0–46.0)
Hemoglobin: 12.6 g/dL (ref 12.0–15.0)
Potassium: 4.8 mEq/L (ref 3.5–5.1)
Sodium: 136 mEq/L (ref 135–145)

## 2010-11-04 LAB — CARDIAC PANEL(CRET KIN+CKTOT+MB+TROPI)
CK, MB: 1.9 ng/mL (ref 0.3–4.0)
Total CK: 60 U/L (ref 7–177)

## 2010-11-04 LAB — TROPONIN I: Troponin I: <0.3 ng/mL (ref <0.30)

## 2010-11-04 LAB — CK TOTAL AND CKMB (NOT AT ARMC)
CK, MB: 1.9 ng/mL (ref 0.3–4.0)
Relative Index: INVALID (ref 0.0–2.5)

## 2010-11-05 LAB — CARDIAC PANEL(CRET KIN+CKTOT+MB+TROPI): CK, MB: 2.1 ng/mL (ref 0.3–4.0)

## 2010-11-05 LAB — BASIC METABOLIC PANEL
CO2: 26 mEq/L (ref 19–32)
Chloride: 100 mEq/L (ref 96–112)
GFR calc non Af Amer: 41 mL/min — ABNORMAL LOW (ref >60)
Glucose, Bld: 107 mg/dL — ABNORMAL HIGH (ref 70–99)
Potassium: 3.9 mEq/L (ref 3.5–5.1)
Sodium: 136 mEq/L (ref 135–145)

## 2010-11-05 LAB — GLUCOSE, CAPILLARY
Glucose-Capillary: 118 mg/dL — ABNORMAL HIGH (ref 70–99)
Glucose-Capillary: 175 mg/dL — ABNORMAL HIGH (ref 70–99)

## 2010-12-06 NOTE — H&P (Signed)
NAMEMARIEA, Leslie Delgado                ACCOUNT NO.:  [id number]  MEDICAL RECORD NO.:  [id number]  LOCATION:  2010                         FACILITY:  [facility]  PHYSICIAN:  Calvert Cantor, M.D.     DATE OF BIRTH:  1921/10/12  DATE OF ADMISSION:  11/04/2010 DATE OF DISCHARGE:  11/05/2010                             HISTORY & PHYSICAL   PRIMARY CARE PHYSICIAN:  Juline Patch, MD  CARDIOLOGIST:  Pamella Pert, MD  PRESENTING COMPLAINT:  Chest pressure.  HISTORY OF PRESENT ILLNESS:  This is an 77 year old female with diabetes and hypertension who complains of "funny feeling" in the center of her chest which occurred today.  When asked about more detail, she said maybe it was an ache or pressure.  It lasted only about a minute, but then she noticed it was tingling in her left hand.  This lasted for a good 2-3 hours.  The patient presents to the ER for these above complaints.  The patient states that she recently had a stress test performed in Dr. Verl Dicker office.  She saw him in the office 2 days ago and was told that she should start Plavix.  She has not yet filled this prescription and therefore not started it.  PAST MEDICAL HISTORY: 1. Positive stress test 2 weeks ago. 2. Hypertension. 3. Diabetes mellitus.  PAST SURGICAL HISTORY:  Tonsillectomy.  SOCIAL HISTORY:  She does not smoke or drink.  No history of drug use.  ALLERGIES: 1. sulfa. 2. penicillin.  HOME MEDICATIONS:  Med rec is not yet available.  She is on the following: 1. Actos. 2. Cymbalta. 3. Carvedilol. 4. Glyburide. 5. Januvia. 6. Pravastatin. 7. Baby aspirin daily.  REVIEW OF SYSTEMS:  No recent weight loss or weight gain.  No fatigue. No fever, chills or sweats.  No frequent headaches.  HEENT:  No blurred vision, double vision, sore throat, sinus trouble, or earache. Respiratory:  No cough or shortness of breath.  CARDIAC:  Chest pain mentioned in H and P.  No palpitations or pedal edema.  GI:  No  nausea, vomiting, diarrhea or constipation.  GU: No dysuria or hematuria. HEMATOLOGIC:  No easy bruising.  SKIN:  No rash.  MUSCULOSKELETAL:  Has occasional joint pain and back pain.  NEUROLOGIC:  No focal numbness or weakness.  No history of stroke.  PSYCHOLOGIC:  No anxiety or depression.  PHYSICAL EXAMINATION:  GENERAL:  Elderly female, sitting up in bed in no acute distress. VITAL SIGNS:  Blood pressure 119/65, pulse 89, respiratory rate 16, pulse ox is 98% on room air. HEENT:  Pupils equal, round, reacting to light.  Extraocular movements are intact.  Conjunctivae are pink.  No scleral icterus.  Oral mucosa is moist. NECK:  Supple.  No thyromegaly, lymphadenopathy, carotid bruits. HEART:  Regular rate and rhythm.  No murmurs, rubs, or gallops. LUNGS: Clear bilaterally.  Normal respiratory effort.  No use of accessory muscles. ABDOMEN:  Soft, nontender, nondistended.  Bowel sounds positive.  No organomegaly. EXTREMITIES:  No cyanosis, clubbing or edema.  Pedal pulses positive. NEUROLOGIC:  Cranial nerves II-XII intact.  Strength intact in all 4 extremities. PSYCHOLOGIC:  Awake, alert, oriented x3.  Mood and  affect normal. SKIN:  Warm and dry.  No rash or bruising.  BLOOD WORK:  Metabolic panel reveals a BUN of 31 and creatinine of 1.10, and a glucose of 182.  Rest of her metabolic panel is normal.  CBC is essentially normal.  First set of cardiac biomarkers are negative.  Chest x-ray reveals mild scarring versus atelectasis at the right lung base.  EKG reveals sinus rhythm with first-degree AV block and left anterior fascicular block.  ASSESSMENT/PLAN: 1. Chest pressure of left hand numbness.  We will do 3 sets of cardiac     enzymes to rule her out for an myocardial infarction.  If they do     come back positive, then will need to contact her cardiologist to     see if he would like to do a cardiac cath.  If they are negative, I     will go ahead and notify Dr. Jacinto Halim  that she was in the hospital for     this issue and see if he wants outpatient followup versus coming to     see here in the hospital.  I have gone ahead and started her Plavix     in addition to her aspirin. 2. Hypertension. 3. Diabetes mellitus.  The patient would like to be a full code.  Time on admission was 50 minutes.     Calvert Cantor, M.D.     SR/MEDQ  D:  11/05/2010  T:  11/06/2010  Job:  [id number]  cc:   Juline Patch, M.D.  Electronically Signed by Calvert Cantor M.D. on 12/06/2010 12:12:53 PM

## 2010-12-06 NOTE — Discharge Summary (Signed)
  NAMEJOAQUINA, Leslie Delgado                ACCOUNT NO.:  [id number]  MEDICAL RECORD NO.:  [id number]  LOCATION:  2010                         FACILITY:  [facility]  PHYSICIAN:  Calvert Cantor, M.D.     DATE OF BIRTH:  01-20-1922  DATE OF ADMISSION:  11/04/2010 DATE OF DISCHARGE:  11/05/2010                              DISCHARGE SUMMARY   PRESENTING COMPLAINT:  Chest pressure.  HISTORY OF PRESENT ILLNESS:  This is an 77 year old female who was recently seen by Dr. Jacinto Halim in the office.  She had a positive stress test and was told to start taking Plavix.  She has not yet gotten the prescription for Plavix filled.  The patient developed pressure in the center of her chest for which she was admitted.  She states that it lasted for less than a minute, but what she also noticed was that she had numbness and tingling in her left hand afterwards, which lasted about 2-3 hours.  She was admitted to be ruled out for an MI and did rule out with three sets of cardiac enzymes.  I did speak with Dr. Jacinto Halim who at this point is recommending that she take nitroglycerin as needed for chest pain.  He currently is a little hesitant to do a cardiac cath and wants to attempt medical management.  He placed her on a statin as well.  She is also taking aspirin.  Her creatinine is elevated at 1.25 which would also put her at risk when doing a cardiac cath.  The patient is now being discharged.  She will follow up with Dr. Jacinto Halim in 2-3 days.  He states he will be calling her.  PHYSICAL EXAM:  LUNGS:  Clear bilaterally. HEART:  Regular rate and rhythm.  No murmurs. ABDOMEN:  Soft, nontender, nondistended.  Bowel sounds positive. EXTREMITIES:  No cyanosis, clubbing or edema.  CONDITION ON DISCHARGE:  Stable.  Follow up with primary care physician and Dr. Jacinto Halim later this week.     Calvert Cantor, M.D.     SR/MEDQ  D:  11/05/2010  T:  11/06/2010  Job:  [id number]  Electronically Signed by Calvert Cantor M.D. on  12/06/2010 12:12:43 PM

## 2012-07-14 ENCOUNTER — Ambulatory Visit: Payer: Medicare Other

## 2012-07-14 ENCOUNTER — Ambulatory Visit (INDEPENDENT_AMBULATORY_CARE_PROVIDER_SITE_OTHER): Payer: Medicare Other | Admitting: Family Medicine

## 2012-07-14 VITALS — BP 136/68 | HR 118 | Temp 98.5°F | Resp 18 | Ht 63.0 in | Wt 180.6 lb

## 2012-07-14 DIAGNOSIS — S81801A Unspecified open wound, right lower leg, initial encounter: Secondary | ICD-10-CM

## 2012-07-14 DIAGNOSIS — Z23 Encounter for immunization: Secondary | ICD-10-CM

## 2012-07-14 DIAGNOSIS — S91009A Unspecified open wound, unspecified ankle, initial encounter: Secondary | ICD-10-CM

## 2012-07-14 MED ORDER — DOXYCYCLINE HYCLATE 100 MG PO TABS: 100.0000 mg | ORAL_TABLET | Freq: Two times a day (BID) | ORAL | Status: DC

## 2012-07-14 NOTE — Progress Notes (Signed)
  Subjective:    Patient ID: Leslie Delgado, female    DOB: [birth year], 79 y.o.   MRN: [id number]  HPI Leslie Delgado is a 79 y.o. female New pt to office - being seen today after injury to shin. 2 nights ago - fell and scraped on some steps,  Lost footing on steps, thinks hit R shin on front of corner of step. Still oozing some blood.    Last tetanus vaccination unknown - possibly over 10 years.   Takes plavix and ASA  - for possible CAD.   PCP: Juline Patch.   Review of Systems  Constitutional: Negative for fever and chills.  Skin: Positive for wound (R leg, with oozing of blood. ).       Objective:   Physical Exam  Vitals reviewed. Constitutional: She is oriented to person, place, and time. She appears well-developed and well-nourished. No distress.  Pulmonary/Chest: Effort normal.  Musculoskeletal:       Right knee: She exhibits normal range of motion and no swelling. No tenderness found.       Right ankle: She exhibits swelling and ecchymosis (dependent echymosis from lower leg. no bony ttp or pain in R ankle. ).  Neurological: She is alert and oriented to person, place, and time.  nvi distally.   Skin: Skin is warm and dry.     Psychiatric: She has a normal mood and affect. Her behavior is normal.  [facility] reading (PRIMARY) by  Dr. Neva Seat: R tib-fib: no fx identified.      Assessment & Plan:  Leslie Delgado is a 79 y.o. female Wound of right leg, initial encounter - Plan: DG Tibia/Fibula Right, Td vaccine greater than or equal to 7yo preservative free IM  R leg - now almost 48 hours from time of injury with central deeper component than skin tear. Tetanus updated, will start doxycycline BID, refer to wound care tomorrow at 8am for eval. Start with bactroban topically, telfa, kerlix wrap after cleansed with soap and water in office. No current signs of bleeding or infection.  Patient Instructions  Your tetanus was updated today, and due to the time since the injury will  need to have wound care assistance in best approach for helping this area to heal.  Keep current bandage in place until that evaluation. Start the antibiotic - twice per day to help prevent against infection.  Return to the clinic or go to the nearest emergency room if any of your symptoms worsen or new symptoms occur.

## 2012-07-14 NOTE — Patient Instructions (Addendum)
Your tetanus was updated today, and due to the time since the injury will need to have wound care assistance in best approach for helping this area to heal.  Keep current bandage in place until that evaluation. Start the antibiotic - twice per day to help prevent against infection.  Return to the clinic or go to the nearest emergency room if any of your symptoms worsen or new symptoms occur.

## 2012-07-15 ENCOUNTER — Encounter (HOSPITAL_BASED_OUTPATIENT_CLINIC_OR_DEPARTMENT_OTHER): Payer: Medicare Other | Attending: General Surgery

## 2012-07-15 DIAGNOSIS — I252 Old myocardial infarction: Secondary | ICD-10-CM | POA: Insufficient documentation

## 2012-07-15 DIAGNOSIS — Z9089 Acquired absence of other organs: Secondary | ICD-10-CM | POA: Insufficient documentation

## 2012-07-15 DIAGNOSIS — S81809A Unspecified open wound, unspecified lower leg, initial encounter: Secondary | ICD-10-CM | POA: Insufficient documentation

## 2012-07-15 DIAGNOSIS — W19XXXA Unspecified fall, initial encounter: Secondary | ICD-10-CM | POA: Insufficient documentation

## 2012-07-15 DIAGNOSIS — I2510 Atherosclerotic heart disease of native coronary artery without angina pectoris: Secondary | ICD-10-CM | POA: Insufficient documentation

## 2012-07-15 DIAGNOSIS — E119 Type 2 diabetes mellitus without complications: Secondary | ICD-10-CM | POA: Insufficient documentation

## 2012-07-15 DIAGNOSIS — Z79899 Other long term (current) drug therapy: Secondary | ICD-10-CM | POA: Insufficient documentation

## 2012-07-15 DIAGNOSIS — Z90710 Acquired absence of both cervix and uterus: Secondary | ICD-10-CM | POA: Insufficient documentation

## 2012-07-15 DIAGNOSIS — Z7902 Long term (current) use of antithrombotics/antiplatelets: Secondary | ICD-10-CM | POA: Insufficient documentation

## 2012-07-15 DIAGNOSIS — S81009A Unspecified open wound, unspecified knee, initial encounter: Secondary | ICD-10-CM | POA: Insufficient documentation

## 2012-07-15 DIAGNOSIS — I87309 Chronic venous hypertension (idiopathic) without complications of unspecified lower extremity: Secondary | ICD-10-CM | POA: Insufficient documentation

## 2012-07-15 DIAGNOSIS — I10 Essential (primary) hypertension: Secondary | ICD-10-CM | POA: Insufficient documentation

## 2012-07-15 LAB — GLUCOSE, CAPILLARY: Glucose-Capillary: 305 mg/dL — ABNORMAL HIGH (ref 70–99)

## 2012-07-16 NOTE — Progress Notes (Signed)
[facility]  NAME:  Leslie Delgado, UNGAR                ACCOUNT NO.:  [id number]  MEDICAL RECORD NO.:  [id number]      DATE OF BIRTH:  1921-07-05  PHYSICIAN:  Ardath Sax, M.D.           VISIT DATE:                                  OFFICE VISIT   This is an 79 year old lady who acts very much younger than that.  She comes here because she fell and she has a laceration avulsion injury to the anterior aspect of her right leg.  It is about 7 x 4 cm.  It just peeled the skin right off the anterior leg.  She is a diabetic.  She has had a heart attack before, but as I mentioned, she is very alert and active.  She has had an appendectomy and a hysterectomy.  She takes oral medication for her diabetes.  She also is on carvedilol for mild hypertension.  She is also on Plavix and Lasix and Cymbalta.  I washed the wound and debrided it a little bit, and I am going to treat it with some compression as she does have some venous hypertension, and I put silver alginate on it.  This may be a leg that I would want to try to put a biologic skin substitute, perhaps an Apligraf would be perfect, but we will clean it up first and we will go from there.  She will be back in a week.  DIAGNOSES:  Traumatic laceration anterior aspect of the right leg with avulsion of tissue, venous hypertension, history of coronary artery disease, and a history of type 2 diabetes, and hypertension.     Ardath Sax, M.D.     PP/MEDQ  D:  07/15/2012  T:  07/16/2012  Job:  [id number]

## 2012-07-22 ENCOUNTER — Encounter (HOSPITAL_BASED_OUTPATIENT_CLINIC_OR_DEPARTMENT_OTHER): Payer: Medicare Other | Attending: General Surgery

## 2012-07-22 DIAGNOSIS — W19XXXA Unspecified fall, initial encounter: Secondary | ICD-10-CM | POA: Insufficient documentation

## 2012-07-22 DIAGNOSIS — L97909 Non-pressure chronic ulcer of unspecified part of unspecified lower leg with unspecified severity: Secondary | ICD-10-CM | POA: Insufficient documentation

## 2012-07-22 DIAGNOSIS — S81009A Unspecified open wound, unspecified knee, initial encounter: Secondary | ICD-10-CM | POA: Insufficient documentation

## 2012-07-22 DIAGNOSIS — I87319 Chronic venous hypertension (idiopathic) with ulcer of unspecified lower extremity: Secondary | ICD-10-CM | POA: Insufficient documentation

## 2012-07-22 LAB — GLUCOSE, CAPILLARY: Glucose-Capillary: 287 mg/dL — ABNORMAL HIGH (ref 70–99)

## 2012-07-29 LAB — GLUCOSE, CAPILLARY: Glucose-Capillary: 317 mg/dL — ABNORMAL HIGH (ref 70–99)

## 2012-08-19 ENCOUNTER — Encounter (HOSPITAL_BASED_OUTPATIENT_CLINIC_OR_DEPARTMENT_OTHER): Payer: Medicare Other | Attending: General Surgery

## 2012-08-19 DIAGNOSIS — I87309 Chronic venous hypertension (idiopathic) without complications of unspecified lower extremity: Secondary | ICD-10-CM | POA: Insufficient documentation

## 2012-08-19 DIAGNOSIS — L97809 Non-pressure chronic ulcer of other part of unspecified lower leg with unspecified severity: Secondary | ICD-10-CM | POA: Insufficient documentation

## 2012-08-19 DIAGNOSIS — I872 Venous insufficiency (chronic) (peripheral): Secondary | ICD-10-CM | POA: Insufficient documentation

## 2012-09-14 ENCOUNTER — Encounter: Payer: Medicare Other | Attending: Plastic Surgery | Admitting: *Deleted

## 2012-09-14 DIAGNOSIS — E1169 Type 2 diabetes mellitus with other specified complication: Secondary | ICD-10-CM | POA: Insufficient documentation

## 2012-09-14 DIAGNOSIS — Z713 Dietary counseling and surveillance: Secondary | ICD-10-CM | POA: Insufficient documentation

## 2012-09-14 DIAGNOSIS — L98499 Non-pressure chronic ulcer of skin of other sites with unspecified severity: Secondary | ICD-10-CM | POA: Insufficient documentation

## 2012-09-19 NOTE — Progress Notes (Signed)
Patient attended 90 minute Diabetes Education Class on 09/15/2002  Topics taught include:  Physiology of Diabetes  Carb Counting and Healthy Eating Habits  Reading Food Labels  Rationale of spreading Carb containing foods evenly throughout the day  Meal Planning   

## 2012-09-23 ENCOUNTER — Encounter (HOSPITAL_BASED_OUTPATIENT_CLINIC_OR_DEPARTMENT_OTHER): Payer: Medicare Other | Attending: General Surgery

## 2012-09-23 DIAGNOSIS — L97909 Non-pressure chronic ulcer of unspecified part of unspecified lower leg with unspecified severity: Secondary | ICD-10-CM | POA: Insufficient documentation

## 2013-11-18 ENCOUNTER — Ambulatory Visit (INDEPENDENT_AMBULATORY_CARE_PROVIDER_SITE_OTHER): Payer: Medicare Other | Admitting: Family Medicine

## 2013-11-18 VITALS — BP 146/74 | HR 103 | Temp 98.2°F | Resp 18 | Ht 63.0 in | Wt 175.0 lb

## 2013-11-18 DIAGNOSIS — B029 Zoster without complications: Secondary | ICD-10-CM

## 2013-11-18 DIAGNOSIS — H02846 Edema of left eye, unspecified eyelid: Secondary | ICD-10-CM

## 2013-11-18 DIAGNOSIS — H53139 Sudden visual loss, unspecified eye: Secondary | ICD-10-CM

## 2013-11-18 DIAGNOSIS — E119 Type 2 diabetes mellitus without complications: Secondary | ICD-10-CM

## 2013-11-18 DIAGNOSIS — R21 Rash and other nonspecific skin eruption: Secondary | ICD-10-CM

## 2013-11-18 DIAGNOSIS — H53132 Sudden visual loss, left eye: Secondary | ICD-10-CM

## 2013-11-18 DIAGNOSIS — H0019 Chalazion unspecified eye, unspecified eyelid: Secondary | ICD-10-CM

## 2013-11-18 MED ORDER — VALACYCLOVIR HCL 1 G PO TABS: 1000.0000 mg | ORAL_TABLET | Freq: Three times a day (TID) | ORAL | Status: DC

## 2013-11-18 MED ORDER — HYDROCODONE-ACETAMINOPHEN 5-325 MG PO TABS: 1.0000 | ORAL_TABLET | Freq: Every evening | ORAL | Status: DC | PRN

## 2013-11-18 NOTE — Progress Notes (Signed)
Patient ID: Leslie ColtRachel B Delgado, female   DOB: Jul 11, 1921, 80 y.o.   MRN: [id number]   Subjective:    Patient ID: Leslie Delgado, female    DOB: Jul 11, 1921, 80 y.o.   MRN: [id number] This chart was scribed for Ethelda ChickKristi M Franca Stakes, MD by Modena JanskyAlbert Thayil, ED Scribe. This patient was seen in Room 3 and the patient's care was started at 4:28 PM.  11/18/2013  Belepharitis and Headache   HPI HPI Comments: Leslie Delgado is a 80 y.o. female who presents to the [facility] complaining of a swollen left eyelid that started last night. She states that she has associated erythema around the affected eyelid on the face.   She reports chronic trouble with loss of peripheral vision on L with acute worsening today she thinks.   Aside from that, she reports no blurred or double vision. She reports no eye pain, even with movement. She also reports no eye discharge or unusual tearing. She denies any numbness or tingling in her eyelid. She states that she has had B retinal detachment.   She also reports associated moderate headache towards the front left side of her head that started last night. She states that she has had a root canal two weeks ago and a low grade fever of 99 at home since then. Her PCP started her on Clindamycin again this week for low grade fever and persistent tooth pain.  Does not check sugars at home; however, HgbA1c was 7.2 last week.  PCP- Dr. Ricki MillerPang Eye doctor- Dr. Lorn JunesEcker, Optometrist  Review of Systems  Constitutional: Positive for fever. Negative for chills, diaphoresis and fatigue.  Eyes: Positive for redness and visual disturbance. Negative for photophobia, pain, discharge and itching.  Skin: Positive for color change and rash.  Neurological: Positive for headaches. Negative for weakness and numbness.    Past Medical History  Diagnosis Date  . Cataract   . Diabetes mellitus without complication   . Heart murmur   . Hypertension   . Myocardial infarction   .  Osteoporosis    Allergies  Allergen Reactions  . Lotensin [Benazepril Hcl]   . Penicillins   . Sulfa Antibiotics Rash   Current Outpatient Prescriptions  Medication Sig Dispense Refill  . Alpha-Lipoic Acid 600 MG CAPS Take by mouth.      Marland Kitchen. aspirin 81 MG tablet Take 81 mg by mouth daily.      . carvedilol (COREG) 6.25 MG tablet Take 6.25 mg by mouth 2 (two) times daily with a meal.      . cholecalciferol (VITAMIN D) 400 UNITS TABS Take 2,000 Units by mouth.      . clindamycin (CLEOCIN) 150 MG capsule Take 150 mg by mouth 3 (three) times daily.      . clopidogrel (PLAVIX) 75 MG tablet Take 75 mg by mouth daily.      Marland Kitchen. doxycycline (VIBRA-TABS) 100 MG tablet Take 1 tablet (100 mg total) by mouth 2 (two) times daily.  20 tablet  0  . DULoxetine (CYMBALTA) 60 MG capsule Take 60 mg by mouth daily.      . fish oil-omega-3 fatty acids 1000 MG capsule Take 2 g by mouth daily.      Marland Kitchen. glyBURIDE-metformin (GLUCOVANCE) 5-500 MG per tablet Take 2 tablets by mouth daily with breakfast.      . Multiple Vitamins-Minerals (MULTIVITAMIN WITH MINERALS) tablet Take 1 tablet by mouth daily.      . pravastatin (PRAVACHOL) 20 MG tablet Take  20 mg by mouth daily.      . sitaGLIPtin (JANUVIA) 100 MG tablet Take 100 mg by mouth daily.      Marland Kitchen. acetaminophen (TYLENOL) 650 MG CR tablet Take 650 mg by mouth every 8 (eight) hours as needed for pain.      . furosemide (LASIX) 20 MG tablet Take 20 mg by mouth 2 (two) times daily.      Marland Kitchen. HYDROcodone-acetaminophen (NORCO/VICODIN) 5-325 MG per tablet Take 1 tablet by mouth at bedtime as needed for moderate pain.  15 tablet  0  . nitroGLYCERIN (NITROSTAT) 0.4 MG SL tablet Place 0.4 mg under the tongue every 5 (five) minutes as needed for chest pain.      . valACYclovir (VALTREX) 1000 MG tablet Take 1 tablet (1,000 mg total) by mouth 3 (three) times daily.  30 tablet  0   No current facility-administered medications for this visit.   History   Social History  . Marital  Status: Widowed    Spouse Name: N/A    Number of Children: N/A  . Years of Education: N/A   Occupational History  . Not on file.   Social History Main Topics  . Smoking status: Never Smoker   . Smokeless tobacco: Not on file  . Alcohol Use: No  . Drug Use: No  . Sexual Activity: No   Other Topics Concern  . Not on file   Social History Narrative  . No narrative on file        Objective:    BP 146/74  Pulse 103  Temp(Src) 98.2 F (36.8 C) (Oral)  Resp 18  Ht 5\' 3"  (1.6 m)  Wt 175 lb (79.379 kg)  BMI 31.01 kg/m2  SpO2 97% Physical Exam  Nursing note and vitals reviewed. Constitutional: She is oriented to person, place, and time. She appears well-developed and well-nourished. No distress.  HENT:  Head: Normocephalic and atraumatic.  Right Ear: Tympanic membrane and external ear normal.  Left Ear: Tympanic membrane and external ear normal.  Nose: Nose normal.  Mouth/Throat: Oropharynx is clear and moist. No oropharyngeal exudate.  Eyes: Conjunctivae and EOM are normal. Pupils are equal, round, and reactive to light. Right eye exhibits no discharge, no exudate and no hordeolum. No foreign body present in the right eye. Left eye exhibits no discharge, no exudate and no hordeolum. No foreign body present in the left eye.  L upper eyelid without swelling; scattered erythema without local swelling in pustule.  Neck: Neck supple. No tracheal deviation present.  Cardiovascular: Normal rate, regular rhythm, normal heart sounds and intact distal pulses.   Pulmonary/Chest: Effort normal. No respiratory distress.  Abdominal: Soft. Bowel sounds are normal. She exhibits no distension. There is no tenderness. There is no rebound and no guarding.  Musculoskeletal: Normal range of motion.  Neurological: She is alert and oriented to person, place, and time.  Skin: Skin is warm and dry. Rash noted.  Very mild macular papular rash on her left eyelid that extends toward the left aspect  of her forehead and L parietal scalp.  Psychiatric: She has a normal mood and affect. Her behavior is normal.   Results for orders placed in visit on 07/22/12  GLUCOSE, CAPILLARY      Result Value Ref Range   Glucose-Capillary 287 (*) 70 - 99 mg/dL  GLUCOSE, CAPILLARY      Result Value Ref Range   Glucose-Capillary 317 (*) 70 - 99 mg/dL   Comment 1 Notify RN  Comment 2 Documented in Chart    GLUCOSE, CAPILLARY      Result Value Ref Range   Glucose-Capillary 224 (*) 70 - 99 mg/dL       Assessment & Plan:  Eyelid gland swelling, left - Plan: Ambulatory referral to Ophthalmology  Facial rash - Plan: Ambulatory referral to Ophthalmology  Acute loss of vision, left  Herpes zoster  Type II or unspecified type diabetes mellitus without mention of complication, not stated as uncontrolled  1.  L eyelid erythema with mild swelling: New.  No evidence of hordoleum.  Concerning for early Zoster. 2.  Possible loss of peripheral vision L:  Worsening; history of B retinal detachments.  Discussed case with Dr. Dione Booze of ophthalmology; recommend full dilated fundoscopic exam on Monday, 7/6 at 3:45pm.  No urgent evaluation at this time. 3.  Herpes Zoster L facial region:  New.  Rx for Valtrex 1gr tid.  RTC 24 hours for reevaluation; appointment with Dr. Dione Booze on Monday, 11/22/13 for full ophthalmologic examination. 4. DMII; controlled with recent HgbA1c of 7.2.  Meds ordered this encounter  Medications  . clindamycin (CLEOCIN) 150 MG capsule    Sig: Take 150 mg by mouth 3 (three) times daily.  . valACYclovir (VALTREX) 1000 MG tablet    Sig: Take 1 tablet (1,000 mg total) by mouth 3 (three) times daily.    Dispense:  30 tablet    Refill:  0  . HYDROcodone-acetaminophen (NORCO/VICODIN) 5-325 MG per tablet    Sig: Take 1 tablet by mouth at bedtime as needed for moderate pain.    Dispense:  15 tablet    Refill:  0    Return in about 1 day (around 11/19/2013) for recheck.  I personally  performed the services described in this documentation, which was scribed in my presence.  The recorded information has been reviewed and is accurate.  Nilda Simmer, M.D.  [facility] [address] [place], [place]  [phone] phone [phone] fax

## 2013-11-18 NOTE — Patient Instructions (Addendum)
Dr. [address]ione [address]oozeGroat [address]       Phone Number: [phone] Monday, 11/22/13  Shingles Shingles (herpes zoster) is an infection that is caused by the same virus that causes chickenpox (varicella). The infection causes a painful skin rash and fluid-filled blisters, which eventually break open, crust over, and heal. It may occur in any area of the body, but it usually affects only one side of the body or face. The pain of shingles usually lasts about 1 month. However, some people with shingles may develop long-term (chronic) pain in the affected area of the body. Shingles often occurs many years after the person had chickenpox. It is more common:  In people older than 50 years.  In people with weakened immune systems, such as those with HIV, AIDS, or cancer.  In people taking medicines that weaken the immune system, such as transplant medicines.  In people under great stress. CAUSES  Shingles is caused by the varicella zoster virus (VZV), which also causes chickenpox. After a person is infected with the virus, it can remain in the person's body for years in an inactive state (dormant). To cause shingles, the virus reactivates and breaks out as an infection in a nerve root. The virus can be spread from person to person (contagious) through contact with open blisters of the shingles rash. It will only spread to people who have not had chickenpox. When these people are exposed to the virus, they may develop chickenpox. They will not develop shingles. Once the blisters scab over, the person is no longer contagious and cannot spread the virus to others. SYMPTOMS  Shingles shows up in stages. The initial symptoms may be pain, itching, and tingling in an area of the skin. This pain is usually described as burning, stabbing, or throbbing.In a few days or weeks, a painful red rash will appear in the area where the pain, itching, and tingling were felt. The rash is usually on one side of the body in a band or  belt-like pattern. Then, the rash usually turns into fluid-filled blisters. They will scab over and dry up in approximately 2-3 weeks. Flu-like symptoms may also occur with the initial symptoms, the rash, or the blisters. These may include:  Fever.  Chills.  Headache.  Upset stomach. DIAGNOSIS  Your caregiver will perform a skin exam to diagnose shingles. Skin scrapings or fluid samples may also be taken from the blisters. This sample will be examined under a microscope or sent to a lab for further testing. TREATMENT  There is no specific cure for shingles. Your caregiver will likely prescribe medicines to help you manage the pain, recover faster, and avoid long-term problems. This may include antiviral drugs, anti-inflammatory drugs, and pain medicines. HOME CARE INSTRUCTIONS   Take a cool bath or apply cool compresses to the area of the rash or blisters as directed. This may help with the pain and itching.   Only take over-the-counter or prescription medicines as directed by your caregiver.   Rest as directed by your caregiver.  Keep your rash and blisters clean with mild soap and cool water or as directed by your caregiver.  Do not pick your blisters or scratch your rash. Apply an anti-itch cream or numbing creams to the affected area as directed by your caregiver.  Keep your shingles rash covered with a loose bandage (dressing).  Avoid skin contact with:  Babies.   Pregnant women.   Children with eczema.   Elderly people with transplants.  People with chronic illnesses, such as leukemia or AIDS.   Wear loose-fitting clothing to help ease the pain of material rubbing against the rash.  Keep all follow-up appointments with your caregiver.If the area involved is on your face, you may receive a referral for follow-up to a specialist, such as an eye doctor (ophthalmologist) or an ear, nose, and throat (ENT) doctor. Keeping all follow-up appointments will help you  avoid eye complications, chronic pain, or disability.  SEEK IMMEDIATE MEDICAL CARE IF:   You have facial pain, pain around the eye area, or loss of feeling on one side of your face.  You have ear pain or ringing in your ear.  You have loss of taste.  Your pain is not relieved with prescribed medicines.   Your redness or swelling spreads.   You have more pain and swelling.  Your condition is worsening or has changed.   You have a feveror persistent symptoms for more than 2-3 days.  You have a fever and your symptoms suddenly get worse. MAKE SURE YOU:  Understand these instructions.  Will watch your condition.  Will get help right away if you are not doing well or get worse. Document Released: 05/06/2005 Document Revised: 01/29/2012 Document Reviewed: 12/19/2011 [facility] Patient Information 2015 [employer] [place]. This information is not intended to replace advice given to you by your health care provider. Make sure you discuss any questions you have with your health care provider.

## 2013-11-19 ENCOUNTER — Ambulatory Visit (INDEPENDENT_AMBULATORY_CARE_PROVIDER_SITE_OTHER): Payer: Medicare Other | Admitting: Family Medicine

## 2013-11-19 VITALS — BP 120/71 | HR 79 | Temp 98.0°F | Resp 18 | Ht 63.0 in | Wt 175.0 lb

## 2013-11-19 DIAGNOSIS — B029 Zoster without complications: Secondary | ICD-10-CM

## 2013-11-19 DIAGNOSIS — H53452 Other localized visual field defect, left eye: Secondary | ICD-10-CM

## 2013-11-19 DIAGNOSIS — H53459 Other localized visual field defect, unspecified eye: Secondary | ICD-10-CM

## 2013-11-19 DIAGNOSIS — E119 Type 2 diabetes mellitus without complications: Secondary | ICD-10-CM

## 2013-11-19 LAB — GLUCOSE, POCT (MANUAL RESULT ENTRY): POC GLUCOSE: 276 mg/dL — AB (ref 70–99)

## 2013-11-19 NOTE — Progress Notes (Signed)
Subjective:    Patient ID: Leslie Delgado, female    DOB: May 31, 1921, 80 y.o.   MRN: [id number]  11/19/2013  Herpes Zoster   HPI This 80 y.o. female presents for 24 hour follow-up of headache, facial rash, vision changes; treated for presumed Herpes Zoster with Valtrex.   Did well in past 24 hours.  No fever/chills/sweats.  Headache persistent last night; took Hydrocodone last night and slept very well.  Slight numbness along L forehead.  Feels a blister on forehead this morning.  No vision changes No eye redness or drainage.  No change in peripheral vision or overall vision in past 24 hours.  Compliance with Valtrex 1000mg  tid; no side effects. Does not check sugars.   Review of Systems  Constitutional: Negative for fever, chills, diaphoresis and fatigue.  Eyes: Negative for photophobia, pain, discharge, redness, itching and visual disturbance.  Endocrine: Negative for polydipsia, polyphagia and polyuria.  Skin: Positive for rash.  Neurological: Positive for numbness and headaches. Negative for dizziness, tremors, syncope, facial asymmetry, speech difficulty, weakness and light-headedness.  Hematological: Negative for adenopathy. Does not bruise/bleed easily.    Past Medical History  Diagnosis Date  . Cataract   . Diabetes mellitus without complication   . Heart murmur   . Hypertension   . Myocardial infarction   . Osteoporosis    Past Surgical History  Procedure Laterality Date  . Appendectomy    . Eye surgery    . Abdominal hysterectomy      Allergies  Allergen Reactions  . Lotensin [Benazepril Hcl]   . Penicillins   . Sulfa Antibiotics Rash   Current Outpatient Prescriptions  Medication Sig Dispense Refill  . acetaminophen (TYLENOL) 650 MG CR tablet Take 650 mg by mouth every 8 (eight) hours as needed for pain.      . Alpha-Lipoic Acid 600 MG CAPS Take by mouth.      Marland Kitchen. aspirin 81 MG tablet Take 81 mg by mouth daily.      . carvedilol (COREG) 6.25 MG tablet Take  6.25 mg by mouth 2 (two) times daily with a meal.      . cholecalciferol (VITAMIN D) 400 UNITS TABS Take 2,000 Units by mouth.      . clindamycin (CLEOCIN) 150 MG capsule Take 150 mg by mouth 3 (three) times daily.      . clopidogrel (PLAVIX) 75 MG tablet Take 75 mg by mouth daily.      Marland Kitchen. doxycycline (VIBRA-TABS) 100 MG tablet Take 1 tablet (100 mg total) by mouth 2 (two) times daily.  20 tablet  0  . DULoxetine (CYMBALTA) 60 MG capsule Take 60 mg by mouth daily.      . fish oil-omega-3 fatty acids 1000 MG capsule Take 2 g by mouth daily.      . furosemide (LASIX) 20 MG tablet Take 20 mg by mouth 2 (two) times daily.      Marland Kitchen. glyBURIDE-metformin (GLUCOVANCE) 5-500 MG per tablet Take 2 tablets by mouth daily with breakfast.      . HYDROcodone-acetaminophen (NORCO/VICODIN) 5-325 MG per tablet Take 1 tablet by mouth at bedtime as needed for moderate pain.  15 tablet  0  . Multiple Vitamins-Minerals (MULTIVITAMIN WITH MINERALS) tablet Take 1 tablet by mouth daily.      . nitroGLYCERIN (NITROSTAT) 0.4 MG SL tablet Place 0.4 mg under the tongue every 5 (five) minutes as needed for chest pain.      . pravastatin (PRAVACHOL) 20 MG tablet Take 20  mg by mouth daily.      . sitaGLIPtin (JANUVIA) 100 MG tablet Take 100 mg by mouth daily.      . valACYclovir (VALTREX) 1000 MG tablet Take 1 tablet (1,000 mg total) by mouth 3 (three) times daily.  30 tablet  0   No current facility-administered medications for this visit.       Objective:    BP 120/71  Pulse 79  Temp(Src) 98 F (36.7 C) (Oral)  Resp 18  Ht 5\' 3"  (1.6 m)  Wt 175 lb (79.379 kg)  BMI 31.01 kg/m2  SpO2 96% Physical Exam  Constitutional: She is oriented to person, place, and time. She appears well-developed and well-nourished. No distress.  HENT:  Head: Normocephalic and atraumatic.    Eyes: Conjunctivae and EOM are normal. Pupils are equal, round, and reactive to light.  Neck: Normal range of motion. Neck supple. No thyromegaly  present.  Cardiovascular: Normal rate, regular rhythm and normal heart sounds.   Pulmonary/Chest: Effort normal and breath sounds normal. She has no wheezes. She has no rales.  Lymphadenopathy:    She has no cervical adenopathy.  Neurological: She is alert and oriented to person, place, and time. No cranial nerve deficit. She exhibits normal muscle tone. Coordination normal.  Skin: Skin is warm and dry. Rash noted. She is not diaphoretic.  Psychiatric: She has a normal mood and affect. Her behavior is normal.   Results for orders placed in visit on 11/19/13  GLUCOSE, POCT (MANUAL RESULT ENTRY)      Result Value Ref Range   POC Glucose 276 (*) 70 - 99 mg/dl       Assessment & Plan:  Herpes zoster  Type II or unspecified type diabetes mellitus without mention of complication, not stated as uncontrolled - Plan: POCT glucose (manual entry)  Peripheral vision loss, left  1. Herpes zoster:  Persistent.  Now with vesicles; continue Valtrex tid x 7 days.  Will not prescribe prednisone due to DMII and age.  Mild to moderate pain at this time; continue Hydrocodone 1 qhs only; pt tolerates with minimal side effects and used with recent dental procedure without side effects. 2.  DMII: controlled; recent HgbA1c of 7.2 per patient. Does not check sugar at home.  Elevated today.   3. L peripheral vision changes:  Stable; appointment with Dr. Dione BoozeGroat on Monday.  No orders of the defined types were placed in this encounter.    Return if symptoms worsen or fail to improve.   Nilda SimmerKristi Jayde Daffin, M.D.  [facility]  [place] [address] [place], [place]  [id number] [phone] phone [phone] fax

## 2013-11-30 ENCOUNTER — Inpatient Hospital Stay (HOSPITAL_COMMUNITY)
Admission: EM | Admit: 2013-11-30 | Discharge: 2013-12-04 | DRG: 690 | Disposition: A | Discharge status: Skilled Nursing Facility | Payer: Medicare Other | Attending: Internal Medicine | Admitting: Internal Medicine

## 2013-11-30 ENCOUNTER — Emergency Department (HOSPITAL_COMMUNITY): Payer: Medicare Other

## 2013-11-30 ENCOUNTER — Encounter (HOSPITAL_COMMUNITY): Payer: Self-pay | Admitting: Emergency Medicine

## 2013-11-30 DIAGNOSIS — T671XXD Heat syncope, subsequent encounter: Secondary | ICD-10-CM

## 2013-11-30 DIAGNOSIS — Z8249 Family history of ischemic heart disease and other diseases of the circulatory system: Secondary | ICD-10-CM

## 2013-11-30 DIAGNOSIS — Z794 Long term (current) use of insulin: Secondary | ICD-10-CM

## 2013-11-30 DIAGNOSIS — N390 Urinary tract infection, site not specified: Principal | ICD-10-CM | POA: Diagnosis present

## 2013-11-30 DIAGNOSIS — I2510 Atherosclerotic heart disease of native coronary artery without angina pectoris: Secondary | ICD-10-CM | POA: Diagnosis present

## 2013-11-30 DIAGNOSIS — E861 Hypovolemia: Secondary | ICD-10-CM | POA: Diagnosis present

## 2013-11-30 DIAGNOSIS — M810 Age-related osteoporosis without current pathological fracture: Secondary | ICD-10-CM | POA: Diagnosis present

## 2013-11-30 DIAGNOSIS — I252 Old myocardial infarction: Secondary | ICD-10-CM

## 2013-11-30 DIAGNOSIS — E860 Dehydration: Secondary | ICD-10-CM | POA: Diagnosis present

## 2013-11-30 DIAGNOSIS — N179 Acute kidney failure, unspecified: Secondary | ICD-10-CM | POA: Diagnosis present

## 2013-11-30 DIAGNOSIS — Z882 Allergy status to sulfonamides status: Secondary | ICD-10-CM

## 2013-11-30 DIAGNOSIS — Z7902 Long term (current) use of antithrombotics/antiplatelets: Secondary | ICD-10-CM

## 2013-11-30 DIAGNOSIS — I10 Essential (primary) hypertension: Secondary | ICD-10-CM | POA: Diagnosis present

## 2013-11-30 DIAGNOSIS — Z888 Allergy status to other drugs, medicaments and biological substances status: Secondary | ICD-10-CM

## 2013-11-30 DIAGNOSIS — R55 Syncope and collapse: Secondary | ICD-10-CM | POA: Diagnosis present

## 2013-11-30 DIAGNOSIS — I509 Heart failure, unspecified: Secondary | ICD-10-CM | POA: Diagnosis present

## 2013-11-30 DIAGNOSIS — Z9089 Acquired absence of other organs: Secondary | ICD-10-CM

## 2013-11-30 DIAGNOSIS — I5022 Chronic systolic (congestive) heart failure: Secondary | ICD-10-CM | POA: Diagnosis present

## 2013-11-30 DIAGNOSIS — L8991 Pressure ulcer of unspecified site, stage 1: Secondary | ICD-10-CM | POA: Diagnosis present

## 2013-11-30 DIAGNOSIS — R4182 Altered mental status, unspecified: Secondary | ICD-10-CM | POA: Diagnosis not present

## 2013-11-30 DIAGNOSIS — Z602 Problems related to living alone: Secondary | ICD-10-CM

## 2013-11-30 DIAGNOSIS — L89309 Pressure ulcer of unspecified buttock, unspecified stage: Secondary | ICD-10-CM | POA: Diagnosis present

## 2013-11-30 DIAGNOSIS — Z79899 Other long term (current) drug therapy: Secondary | ICD-10-CM

## 2013-11-30 DIAGNOSIS — E119 Type 2 diabetes mellitus without complications: Secondary | ICD-10-CM | POA: Diagnosis present

## 2013-11-30 DIAGNOSIS — Z66 Do not resuscitate: Secondary | ICD-10-CM | POA: Diagnosis present

## 2013-11-30 DIAGNOSIS — Z880 Allergy status to penicillin: Secondary | ICD-10-CM

## 2013-11-30 DIAGNOSIS — A498 Other bacterial infections of unspecified site: Secondary | ICD-10-CM | POA: Diagnosis present

## 2013-11-30 DIAGNOSIS — Z7982 Long term (current) use of aspirin: Secondary | ICD-10-CM

## 2013-11-30 DIAGNOSIS — B029 Zoster without complications: Secondary | ICD-10-CM | POA: Diagnosis present

## 2013-11-30 HISTORY — DX: Unspecified osteoarthritis, unspecified site: M19.90

## 2013-11-30 LAB — CBC WITH DIFFERENTIAL/PLATELET
BASOS ABS: 0 10*3/uL (ref 0.0–0.1)
BASOS PCT: 0 % (ref 0–1)
Eosinophils Absolute: 0.1 10*3/uL (ref 0.0–0.7)
Eosinophils Relative: 1 % (ref 0–5)
HEMATOCRIT: 39 % (ref 36.0–46.0)
Hemoglobin: 13 g/dL (ref 12.0–15.0)
Lymphocytes Relative: 14 % (ref 12–46)
Lymphs Abs: 1.4 10*3/uL (ref 0.7–4.0)
MCH: 29.7 pg (ref 26.0–34.0)
MCHC: 33.3 g/dL (ref 30.0–36.0)
MCV: 89.2 fL (ref 78.0–100.0)
Monocytes Absolute: 0.8 10*3/uL (ref 0.1–1.0)
Monocytes Relative: 8 % (ref 3–12)
NEUTROS ABS: 8 10*3/uL — AB (ref 1.7–7.7)
Neutrophils Relative %: 77 % (ref 43–77)
Platelets: 201 10*3/uL (ref 150–400)
RBC: 4.37 MIL/uL (ref 3.87–5.11)
RDW: 16 % — ABNORMAL HIGH (ref 11.5–15.5)
WBC: 10.3 10*3/uL (ref 4.0–10.5)

## 2013-11-30 LAB — URINALYSIS, ROUTINE W REFLEX MICROSCOPIC
BILIRUBIN URINE: NEGATIVE
Glucose, UA: >1000 mg/dL — AB
Ketones, ur: 15 mg/dL — AB
NITRITE: POSITIVE — AB
PROTEIN: NEGATIVE mg/dL
SPECIFIC GRAVITY, URINE: 1.027 (ref 1.005–1.030)
UROBILINOGEN UA: 0.2 mg/dL (ref 0.0–1.0)
pH: 5 (ref 5.0–8.0)

## 2013-11-30 LAB — COMPREHENSIVE METABOLIC PANEL
ALBUMIN: 3.3 g/dL — AB (ref 3.5–5.2)
ALT: 39 U/L — ABNORMAL HIGH (ref 0–35)
AST: 25 U/L (ref 0–37)
Alkaline Phosphatase: 61 U/L (ref 39–117)
Anion gap: 17 — ABNORMAL HIGH (ref 5–15)
BUN: 54 mg/dL — ABNORMAL HIGH (ref 6–23)
CALCIUM: 8.8 mg/dL (ref 8.4–10.5)
CHLORIDE: 99 meq/L (ref 96–112)
CO2: 21 mEq/L (ref 19–32)
CREATININE: 1.76 mg/dL — AB (ref 0.50–1.10)
GFR calc Af Amer: 28 mL/min — ABNORMAL LOW (ref >90)
GFR calc non Af Amer: 24 mL/min — ABNORMAL LOW (ref >90)
Glucose, Bld: 315 mg/dL — ABNORMAL HIGH (ref 70–99)
Potassium: 4.6 mEq/L (ref 3.7–5.3)
Sodium: 137 mEq/L (ref 137–147)
Total Bilirubin: 0.4 mg/dL (ref 0.3–1.2)
Total Protein: 6.3 g/dL (ref 6.0–8.3)

## 2013-11-30 LAB — PHOSPHORUS: Phosphorus: 3.4 mg/dL (ref 2.3–4.6)

## 2013-11-30 LAB — TROPONIN I: Troponin I: <0.3 ng/mL (ref <0.30)

## 2013-11-30 LAB — URINE MICROSCOPIC-ADD ON

## 2013-11-30 LAB — MAGNESIUM: Magnesium: 2.2 mg/dL (ref 1.5–2.5)

## 2013-11-30 LAB — GLUCOSE, CAPILLARY
GLUCOSE-CAPILLARY: 465 mg/dL — AB (ref 70–99)
GLUCOSE-CAPILLARY: 267 mg/dL — AB (ref 70–99)
Glucose-Capillary: 473 mg/dL — ABNORMAL HIGH (ref 70–99)

## 2013-11-30 MED ORDER — GLUCERNA SHAKE PO LIQD
237.0000 mL | Freq: Two times a day (BID) | ORAL | Status: DC
  Administered 2013-12-01 – 2013-12-04 (×7): 237 mL via ORAL

## 2013-11-30 MED ORDER — SIMVASTATIN 10 MG PO TABS
10.0000 mg | ORAL_TABLET | Freq: Every day | ORAL | Status: DC
  Administered 2013-11-30 – 2013-12-03 (×4): 10 mg via ORAL
  Filled 2013-11-30 (×5): qty 1

## 2013-11-30 MED ORDER — OMEGA-3-ACID ETHYL ESTERS 1 G PO CAPS
2.0000 g | ORAL_CAPSULE | Freq: Every day | ORAL | Status: DC
  Administered 2013-11-30 – 2013-12-04 (×5): 2 g via ORAL
  Filled 2013-11-30 (×5): qty 2

## 2013-11-30 MED ORDER — HEPARIN SODIUM (PORCINE) 5000 UNIT/ML IJ SOLN
5000.0000 [IU] | Freq: Three times a day (TID) | INTRAMUSCULAR | Status: DC
  Administered 2013-11-30 – 2013-12-04 (×11): 5000 [IU] via SUBCUTANEOUS
  Filled 2013-11-30 (×12): qty 1

## 2013-11-30 MED ORDER — OMEGA-3 FATTY ACIDS 1000 MG PO CAPS: 2.0000 g | ORAL_CAPSULE | Freq: Every day | ORAL | Status: DC

## 2013-11-30 MED ORDER — SODIUM CHLORIDE 0.9 % IV SOLN
Freq: Once | INTRAVENOUS | Status: AC
  Administered 2013-11-30: 12:00:00 via INTRAVENOUS

## 2013-11-30 MED ORDER — SODIUM CHLORIDE 0.9 % IV SOLN
INTRAVENOUS | Status: DC
  Administered 2013-12-01: 75 mL/h via INTRAVENOUS

## 2013-11-30 MED ORDER — NITROGLYCERIN 0.4 MG SL SUBL: 0.4000 mg | SUBLINGUAL_TABLET | SUBLINGUAL | Status: DC | PRN

## 2013-11-30 MED ORDER — SODIUM CHLORIDE 0.9 % IV SOLN
INTRAVENOUS | Status: AC
  Administered 2013-11-30: 150 mL/h via INTRAVENOUS

## 2013-11-30 MED ORDER — INSULIN ASPART 100 UNIT/ML ~~LOC~~ SOLN
0.0000 [IU] | Freq: Every day | SUBCUTANEOUS | Status: DC
  Administered 2013-11-30: 3 [IU] via SUBCUTANEOUS
  Administered 2013-12-02: 4 [IU] via SUBCUTANEOUS
  Administered 2013-12-03: 3 [IU] via SUBCUTANEOUS

## 2013-11-30 MED ORDER — ACETAMINOPHEN 325 MG PO TABS: 650.0000 mg | ORAL_TABLET | Freq: Three times a day (TID) | ORAL | Status: DC | PRN

## 2013-11-30 MED ORDER — INSULIN ASPART 100 UNIT/ML ~~LOC~~ SOLN
15.0000 [IU] | Freq: Once | SUBCUTANEOUS | Status: AC
  Administered 2013-11-30: 15 [IU] via SUBCUTANEOUS

## 2013-11-30 MED ORDER — CLOPIDOGREL BISULFATE 75 MG PO TABS
75.0000 mg | ORAL_TABLET | Freq: Every day | ORAL | Status: DC
  Administered 2013-11-30 – 2013-12-04 (×5): 75 mg via ORAL
  Filled 2013-11-30 (×5): qty 1

## 2013-11-30 MED ORDER — ACETAMINOPHEN ER 650 MG PO TBCR: 650.0000 mg | EXTENDED_RELEASE_TABLET | Freq: Three times a day (TID) | ORAL | Status: DC | PRN

## 2013-11-30 MED ORDER — CARVEDILOL 3.125 MG PO TABS
3.1250 mg | ORAL_TABLET | Freq: Two times a day (BID) | ORAL | Status: DC
  Administered 2013-11-30 – 2013-12-01 (×2): 3.125 mg via ORAL
  Filled 2013-11-30 (×4): qty 1

## 2013-11-30 MED ORDER — INSULIN ASPART 100 UNIT/ML ~~LOC~~ SOLN
18.0000 [IU] | Freq: Once | SUBCUTANEOUS | Status: AC
  Administered 2013-11-30: 18 [IU] via SUBCUTANEOUS

## 2013-11-30 MED ORDER — DULOXETINE HCL 60 MG PO CPEP: 60.0000 mg | ORAL_CAPSULE | Freq: Every day | ORAL | Status: DC

## 2013-11-30 MED ORDER — ASPIRIN 81 MG PO TABS: 81.0000 mg | ORAL_TABLET | Freq: Every day | ORAL | Status: DC

## 2013-11-30 MED ORDER — ASPIRIN EC 81 MG PO TBEC
81.0000 mg | DELAYED_RELEASE_TABLET | Freq: Every day | ORAL | Status: DC
  Administered 2013-11-30 – 2013-12-04 (×5): 81 mg via ORAL
  Filled 2013-11-30 (×5): qty 1

## 2013-11-30 MED ORDER — ENSURE COMPLETE PO LIQD
237.0000 mL | Freq: Two times a day (BID) | ORAL | Status: DC
  Administered 2013-11-30: 237 mL via ORAL
  Filled 2013-11-30: qty 237

## 2013-11-30 MED ORDER — KCL IN DEXTROSE-NACL 20-5-0.45 MEQ/L-%-% IV SOLN
INTRAVENOUS | Status: DC
  Administered 2013-11-30: 18:00:00 via INTRAVENOUS
  Filled 2013-11-30 (×2): qty 1000

## 2013-11-30 MED ORDER — INSULIN ASPART 100 UNIT/ML ~~LOC~~ SOLN
0.0000 [IU] | Freq: Three times a day (TID) | SUBCUTANEOUS | Status: DC
  Administered 2013-12-01: 1 [IU] via SUBCUTANEOUS
  Administered 2013-12-01 (×2): 5 [IU] via SUBCUTANEOUS

## 2013-11-30 NOTE — ED Notes (Signed)
Shari, PA at bedside. 

## 2013-11-30 NOTE — ED Notes (Addendum)
Per daughter, she normally speaks to pt once a day, spoke to pt last night and pt seemed normal. Daughter went to visit pt this morning, pt answered the door and had syncopal episode. Daughter reports LOC lasting few seconds on multiple episodes; denies injury post fall.  Reports recent increased stressed from root canal x 5 weeks ago and diagnosed with shingles x 2 weeks ago. Pt reports decreased appetite, has been drinking boost for meal replacements. Pt denies any complaints at this time

## 2013-11-30 NOTE — Progress Notes (Signed)
Patient trasfered from ED to [phone] via stretcher; alert and oriented x 4; no complaints of pain; IV saline locked in LAC; skin - pressure ulcer stg 1 on left buttock. Orient patient to room and unit; gave patient care guide; instructed how to use the call bell and  fall risk precautions. Will continue to monitor the patient.

## 2013-11-30 NOTE — ED Notes (Signed)
Pt to ED from home c/o near syncope.  Pt lives alone and family has not heard from pt x 1 week. Family went to check on patient this morning; as soon as patient answered the door, she "passed out." EMS reports patient diaphoretic on arrival. Pt has been on antibiotics for 2 weeks for low grade fever and shingles. Initial BP 94/68, A&O x4; Pt received NS en route with mental status improvement.

## 2013-11-30 NOTE — ED Notes (Signed)
Pt refusing in and out cath. Pt assisted to bedside commode

## 2013-11-30 NOTE — ED Notes (Signed)
[facility] has received 100[facility] of [facility] has [facility].

## 2013-11-30 NOTE — ED Notes (Signed)
Attempted to call report

## 2013-11-30 NOTE — H&P (Addendum)
[facility] History and Physical  Leslie Delgado ZOX:[id number] DOB: 13-Aug-1921 DOA: 11/30/2013  Referring physician: Dr. Lawrence Marseilles PCP: Juline Patch, MD   Chief Complaint: Passed out and not eating and drinking well  HPI: Leslie Delgado is a 80 y.o. female  With history of hypertension and diabetes. Was diagnosed with herpes zoster 2 weeks ago and since infection has not been eating or drinking all that well. She reports improvement in the rash and pain but has not been eating or drinking fluids all at well. While her daughter was visiting her today she went to the door and subsequently passed out. Patient is not sure what makes it better or worse.   While in the ED the patient was found to have an elevated BUN and creatinine ratio, elevated serum creatinine, and dry mucous membranes. We were consulted for further evaluation and recommendations for admission regarding dehydration and acute renal injury.   Review of Systems:  Constitutional:  No weight loss, night sweats, Fevers, chills, fatigue.  HEENT:  No headaches, Difficulty swallowing,Tooth/dental problems,Sore throat,  No sneezing, itching, ear ache, nasal congestion, post nasal drip,  Cardio-vascular:  No chest pain, Orthopnea, PND, swelling in lower extremities, anasarca, dizziness, palpitations  GI:  No heartburn, indigestion, abdominal pain, nausea, vomiting, diarrhea, change in bowel habits, + loss of appetite  Resp:  No shortness of breath with exertion or at rest. No excess mucus, no productive cough, No non-productive cough, No coughing up of blood.No change in color of mucus.No wheezing.No chest wall deformity  Skin:  no rash or lesions.  GU:  no dysuria, change in color of urine, no urgency or frequency. No flank pain.  Musculoskeletal:  No joint pain or swelling. No decreased range of motion. No back pain.  Psych:  No change in mood or affect. No depression or anxiety. No memory loss.   Past Medical  History  Diagnosis Date  . Cataract   . Diabetes mellitus without complication   . Heart murmur   . Hypertension   . Myocardial infarction   . Osteoporosis    Past Surgical History  Procedure Laterality Date  . Appendectomy    . Eye surgery    . Abdominal hysterectomy     Social History:  reports that she has never smoked. She does not have any smokeless tobacco history on file. She reports that she does not drink alcohol or use illicit drugs.  Allergies  Allergen Reactions  . Lotensin [Benazepril Hcl]   . Penicillins   . Sulfa Antibiotics Rash    Family History  Problem Relation Age of Onset  . Heart disease Father   . Cancer Brother   . Heart disease Brother      Prior to Admission medications   Medication Sig Start Date End Date Taking? Authorizing Provider  acetaminophen (TYLENOL) 650 MG CR tablet Take 650 mg by mouth every 8 (eight) hours as needed for pain.   Yes Historical Provider, MD  Alpha-Lipoic Acid 600 MG CAPS Take 600 mg by mouth daily.    Yes Historical Provider, MD  aspirin 81 MG tablet Take 81 mg by mouth daily.   Yes Historical Provider, MD  carvedilol (COREG) 6.25 MG tablet Take 6.25 mg by mouth 2 (two) times daily with a meal.   Yes Historical Provider, MD  cholecalciferol (VITAMIN D) 400 UNITS TABS Take 2,000 Units by mouth daily.    Yes Historical Provider, MD  clopidogrel (PLAVIX) 75 MG tablet Take 75 mg by mouth daily.  Yes Historical Provider, MD  DULoxetine (CYMBALTA) 60 MG capsule Take 60 mg by mouth daily.   Yes Historical Provider, MD  fish oil-omega-3 fatty acids 1000 MG capsule Take 2 g by mouth daily.   Yes Historical Provider, MD  furosemide (LASIX) 20 MG tablet Take 20 mg by mouth 2 (two) times daily.   Yes Historical Provider, MD  glyBURIDE-metformin (GLUCOVANCE) 5-500 MG per tablet Take 2 tablets by mouth daily with breakfast.   Yes Historical Provider, MD  Multiple Vitamins-Minerals (MULTIVITAMIN WITH MINERALS) tablet Take 1 tablet by  mouth daily.   Yes Historical Provider, MD  nitroGLYCERIN (NITROSTAT) 0.4 MG SL tablet Place 0.4 mg under the tongue every 5 (five) minutes as needed for chest pain.   Yes Historical Provider, MD  pravastatin (PRAVACHOL) 20 MG tablet Take 20 mg by mouth daily.   Yes Historical Provider, MD  sitaGLIPtin (JANUVIA) 100 MG tablet Take 100 mg by mouth daily.   Yes Historical Provider, MD   Physical Exam: Filed Vitals:   11/30/13 1630  BP: 132/77  Pulse: 95  Temp: 97.7 F (36.5 C)  Resp: 18    BP 132/77  Pulse 95  Temp(Src) 97.7 F (36.5 C) (Oral)  Resp 18  Ht 5\' 3"  (1.6 m)  Wt 77.293 kg (170 lb 6.4 oz)  BMI 30.19 kg/m2  SpO2 99%  General:  Appears calm and comfortable, smiling laying prone in bed Eyes: PERRL, normal lids, irises & conjunctiva ENT: grossly normal hearing, lips & tongue, dry mucous membranes Neck: no LAD, masses or thyromegaly Cardiovascular: RRR, no m/r/g. No LE edema. Respiratory: CTA bilaterally, no w/r/r. Normal respiratory effort. Abdomen: soft, nt,nd Skin: no rash or induration seen on limited exam Musculoskeletal: grossly normal tone BUE/BLE Psychiatric: grossly normal mood and affect, speech fluent and appropriate Neurologic: grossly non-focal.          Labs on Admission:  Basic Metabolic Panel:  Recent Labs Lab 11/30/13 1212  NA 137  K 4.6  CL 99  CO2 21  GLUCOSE 315*  BUN 54*  CREATININE 1.76*  CALCIUM 8.8   Liver Function Tests:  Recent Labs Lab 11/30/13 1212  AST 25  ALT 39*  ALKPHOS 61  BILITOT 0.4  PROT 6.3  ALBUMIN 3.3*   No results found for this basename: LIPASE, AMYLASE,  in the last 168 hours No results found for this basename: AMMONIA,  in the last 168 hours CBC:  Recent Labs Lab 11/30/13 1212  WBC 10.3  NEUTROABS 8.0*  HGB 13.0  HCT 39.0  MCV 89.2  PLT 201   Cardiac Enzymes:  Recent Labs Lab 11/30/13 1212  TROPONINI <0.30    BNP (last 3 results) No results found for this basename: PROBNP,  in the  last 8760 hours CBG: No results found for this basename: GLUCAP,  in the last 168 hours  Radiological Exams on Admission: Dg Chest Portable 1 View  11/30/2013   CLINICAL DATA:  Altered mental status  EXAM: PORTABLE CHEST - 1 VIEW  COMPARISON:  Portable chest x-ray of November 04, 2010  FINDINGS: The lungs are well-expanded and clear. The cardiac silhouette is mildly enlarged. The pulmonary vascularity is not engorged. The mediastinum is normal. There is no pleural effusion or pneumothorax. There is minimal apical pleural thickening bilaterally. The bony thorax is unremarkable.  IMPRESSION: Mild stable cardiac enlargement without evidence of CHF or other acute cardiopulmonary abnormality.   Electronically Signed   By: David  [place]   On: 11/30/2013 12:40  EKG: Independently reviewed. Sinus rhythm with no ST elevations or depressions  Assessment/Plan Principal Problem:   Dehydration - Patient had herpes zoster infection reportedly 2 weeks ago and since then has not been eating or drinking fluid all that well. Suspect this is been cause. - Maintenance IV fluids - Glucerna nutritional supplement   Active Problems:   Acute renal failure - Given history most likely due to prerenal causes. - Will continue to monitor serum creatinine after fluid administration. Should serum creatinine remaining elevated would plan on evaluating for other causes and expanding differential.    Syncope - Most likely due to intravascular hypovolemia - Will continue to monitor blood pressures - Orthostatic vital signs every shift - Continue maintenance IV fluids - Decrease beta blocker dose by half and consider discontinuing altogether should patient continue to be orthostatic  Diabetes mellitus - Hold patient home regimen - Place on sliding scale insulin and diabetic diet - Monitor CBGs routinely  DVT prophylaxis - Heparin SQ   Code Status: DNR Family Communication: discussed with family at  bedside Disposition Plan: Pending improvement in condition  Time spent: > 55 minutes  Penny Pia [facility] Pager [phone]  **Disclaimer: This note may have been dictated with voice recognition software. Similar sounding words can inadvertently be transcribed and this note may contain transcription errors which may not have been corrected upon publication of note.**   Addendum Given patient's creatinine clearance we'll hold Cymbalta. After renal function improves may consider resuming. Discussed with pharmacist

## 2013-11-30 NOTE — ED Provider Notes (Signed)
CSN: [id number]     Arrival date & time 11/30/13  1143 History   First MD Initiated Contact with Patient 11/30/13 1151     Chief Complaint  Patient presents with  . Altered Mental Status     (Consider location/radiation/quality/duration/timing/severity/associated sxs/prior Treatment) Patient is a 80 y.o. female presenting with syncope. The history is provided by the patient. No language interpreter was used.  Loss of Consciousness Episode history:  Multiple Most recent episode:  Today Associated symptoms: weakness   Associated symptoms: no chest pain, no confusion, no fever, no headaches, no nausea, no shortness of breath and no vomiting   Associated symptoms comment:  The patient presents with her daughter who went to the patient's house this morning to check on her. She lives alone in her home. She has recently been treated for shingles as well as having a low grade temperature for the past several weeks. Per her daughter the patient has not been in contact for several days. When she went to the patient's house this morning, the patient answered the door and then passed out. She was brought to the floor without fall or injury. Her daughter reports she woke and then passed out more than once. The patient denies SOB, cough, abdominal/chest pain, headache, vomiting, diarrhea, dysuria or frequency.   Past Medical History  Diagnosis Date  . Cataract   . Diabetes mellitus without complication   . Heart murmur   . Hypertension   . Myocardial infarction   . Osteoporosis    Past Surgical History  Procedure Laterality Date  . Appendectomy    . Eye surgery    . Abdominal hysterectomy     Family History  Problem Relation Age of Onset  . Heart disease Father   . Cancer Brother   . Heart disease Brother    History  Substance Use Topics  . Smoking status: Never Smoker   . Smokeless tobacco: Not on file  . Alcohol Use: No   OB History   Grav Para Term Preterm Abortions TAB SAB Ect  Mult Living                 Review of Systems  Constitutional: Positive for appetite change. Negative for fever and chills.  HENT: Negative.  Negative for congestion and sore throat.   Respiratory: Negative.  Negative for cough and shortness of breath.   Cardiovascular: Positive for syncope. Negative for chest pain.  Gastrointestinal: Negative.  Negative for nausea, vomiting, abdominal pain and diarrhea.  Musculoskeletal: Negative.   Skin: Negative.   Neurological: Positive for syncope and weakness. Negative for facial asymmetry, speech difficulty and headaches.  Psychiatric/Behavioral: Negative for hallucinations and confusion.      Allergies  Lotensin; Penicillins; and Sulfa antibiotics  Home Medications   Prior to Admission medications   Medication Sig Start Date End Date Taking? Authorizing Provider  acetaminophen (TYLENOL) 650 MG CR tablet Take 650 mg by mouth every 8 (eight) hours as needed for pain.   Yes Historical Provider, MD  Alpha-Lipoic Acid 600 MG CAPS Take 600 mg by mouth daily.    Yes Historical Provider, MD  aspirin 81 MG tablet Take 81 mg by mouth daily.   Yes Historical Provider, MD  carvedilol (COREG) 6.25 MG tablet Take 6.25 mg by mouth 2 (two) times daily with a meal.   Yes Historical Provider, MD  cholecalciferol (VITAMIN D) 400 UNITS TABS Take 2,000 Units by mouth daily.    Yes Historical Provider, MD  clopidogrel (PLAVIX)  75 MG tablet Take 75 mg by mouth daily.   Yes Historical Provider, MD  DULoxetine (CYMBALTA) 60 MG capsule Take 60 mg by mouth daily.   Yes Historical Provider, MD  fish oil-omega-3 fatty acids 1000 MG capsule Take 2 g by mouth daily.   Yes Historical Provider, MD  furosemide (LASIX) 20 MG tablet Take 20 mg by mouth 2 (two) times daily.   Yes Historical Provider, MD  glyBURIDE-metformin (GLUCOVANCE) 5-500 MG per tablet Take 2 tablets by mouth daily with breakfast.   Yes Historical Provider, MD  Multiple Vitamins-Minerals (MULTIVITAMIN WITH  MINERALS) tablet Take 1 tablet by mouth daily.   Yes Historical Provider, MD  nitroGLYCERIN (NITROSTAT) 0.4 MG SL tablet Place 0.4 mg under the tongue every 5 (five) minutes as needed for chest pain.   Yes Historical Provider, MD  pravastatin (PRAVACHOL) 20 MG tablet Take 20 mg by mouth daily.   Yes Historical Provider, MD  sitaGLIPtin (JANUVIA) 100 MG tablet Take 100 mg by mouth daily.   Yes Historical Provider, MD   BP 122/51  Pulse 82  Temp(Src) 97.1 F (36.2 C) (Temporal)  Resp 20  SpO2 95% Physical Exam  Constitutional: She is oriented to person, place, and time. She appears well-developed and well-nourished.  HENT:  Head: Normocephalic and atraumatic.  Mouth/Throat: Mucous membranes are dry.  Neck: Normal range of motion. Neck supple.  Cardiovascular: Normal rate and regular rhythm.   Pulmonary/Chest: Effort normal and breath sounds normal. She has no wheezes. She has no rales.  Abdominal: Soft. Bowel sounds are normal. There is no tenderness. There is no rebound and no guarding.  Musculoskeletal: Normal range of motion. She exhibits no edema.  Neurological: She is alert and oriented to person, place, and time.  Skin: Skin is warm and dry. No rash noted.  Psychiatric: She has a normal mood and affect.    ED Course  Procedures (including critical care time) Labs Review Labs Reviewed  CBC WITH DIFFERENTIAL - Abnormal; Notable for the following:    RDW 16.0 (*)    Neutro Abs 8.0 (*)    All other components within normal limits  COMPREHENSIVE METABOLIC PANEL - Abnormal; Notable for the following:    Glucose, Bld 315 (*)    BUN 54 (*)    Creatinine, Ser 1.76 (*)    Albumin 3.3 (*)    ALT 39 (*)    GFR calc non Af Amer 24 (*)    GFR calc Af Amer 28 (*)    Anion gap 17 (*)    All other components within normal limits  URINE CULTURE  TROPONIN I  URINALYSIS, ROUTINE W REFLEX MICROSCOPIC   Results for orders placed during the hospital encounter of 11/30/13  CBC WITH  DIFFERENTIAL      Result Value Ref Range   WBC 10.3  4.0 - 10.5 K/uL   RBC 4.37  3.87 - 5.11 MIL/uL   Hemoglobin 13.0  12.0 - 15.0 g/dL   HCT 16.139.0  09.636.0 - 04.546.0 %   MCV 89.2  78.0 - 100.0 fL   MCH 29.7  26.0 - 34.0 pg   MCHC 33.3  30.0 - 36.0 g/dL   RDW 40.916.0 (*) 81.111.5 - 91.415.5 %   Platelets 201  150 - 400 K/uL   Neutrophils Relative % 77  43 - 77 %   Neutro Abs 8.0 (*) 1.7 - 7.7 K/uL   Lymphocytes Relative 14  12 - 46 %   Lymphs Abs 1.4  0.7 - 4.0 K/uL   Monocytes Relative 8  3 - 12 %   Monocytes Absolute 0.8  0.1 - 1.0 K/uL   Eosinophils Relative 1  0 - 5 %   Eosinophils Absolute 0.1  0.0 - 0.7 K/uL   Basophils Relative 0  0 - 1 %   Basophils Absolute 0.0  0.0 - 0.1 K/uL  COMPREHENSIVE METABOLIC PANEL      Result Value Ref Range   Sodium 137  137 - 147 mEq/L   Potassium 4.6  3.7 - 5.3 mEq/L   Chloride 99  96 - 112 mEq/L   CO2 21  19 - 32 mEq/L   Glucose, Bld 315 (*) 70 - 99 mg/dL   BUN 54 (*) 6 - 23 mg/dL   Creatinine, Ser 4.09 (*) 0.50 - 1.10 mg/dL   Calcium 8.8  8.4 - 81.1 mg/dL   Total Protein 6.3  6.0 - 8.3 g/dL   Albumin 3.3 (*) 3.5 - 5.2 g/dL   AST 25  0 - 37 U/L   ALT 39 (*) 0 - 35 U/L   Alkaline Phosphatase 61  39 - 117 U/L   Total Bilirubin 0.4  0.3 - 1.2 mg/dL   GFR calc non Af Amer 24 (*) >90 mL/min   GFR calc Af Amer 28 (*) >90 mL/min   Anion gap 17 (*) 5 - 15  TROPONIN I      Result Value Ref Range   Troponin I <0.30  <0.30 ng/mL     Imaging Review Dg Chest Portable 1 View  11/30/2013   CLINICAL DATA:  Altered mental status  EXAM: PORTABLE CHEST - 1 VIEW  COMPARISON:  Portable chest x-ray of November 04, 2010  FINDINGS: The lungs are well-expanded and clear. The cardiac silhouette is mildly enlarged. The pulmonary vascularity is not engorged. The mediastinum is normal. There is no pleural effusion or pneumothorax. There is minimal apical pleural thickening bilaterally. The bony thorax is unremarkable.  IMPRESSION: Mild stable cardiac enlargement without evidence  of CHF or other acute cardiopulmonary abnormality.   Electronically Signed   By: David  [place]   On: 11/30/2013 12:40     EKG Interpretation   Date/Time:  Tuesday November 30 2013 11:47:57 EDT Ventricular Rate:  78 PR Interval:  56 QRS Duration: 104 QT Interval:  578 QTC Calculation: 659 R Axis:   -63 Text Interpretation:  Sinus rhythm Short PR interval Left ventricular  hypertrophy Inferior infarct, old Anterior infarct, old Prolonged QT  interval No significant change since last tracing Confirmed by GOLDSTON   MD, SCOTT ([id number]) on 11/30/2013 1:23:28 PM      MDM   Final diagnoses:  Dehydration    1. Dehydration  She has not been eating or drinking for several days with syncope today multiple times. NO injury. She is oriented and alert. History corroborated by daughter at bedside. Recent shingles with physical decline through treatment, shingles resolving now. IV hydration given. Admit to observation for rehydration.    Arnoldo Hooker, PA-C 11/30/13 1432

## 2013-12-01 ENCOUNTER — Encounter (HOSPITAL_COMMUNITY): Payer: Self-pay | Admitting: General Practice

## 2013-12-01 DIAGNOSIS — Z882 Allergy status to sulfonamides status: Secondary | ICD-10-CM | POA: Diagnosis not present

## 2013-12-01 DIAGNOSIS — L89309 Pressure ulcer of unspecified buttock, unspecified stage: Secondary | ICD-10-CM | POA: Diagnosis present

## 2013-12-01 DIAGNOSIS — Z79899 Other long term (current) drug therapy: Secondary | ICD-10-CM | POA: Diagnosis not present

## 2013-12-01 DIAGNOSIS — Z602 Problems related to living alone: Secondary | ICD-10-CM | POA: Diagnosis not present

## 2013-12-01 DIAGNOSIS — N179 Acute kidney failure, unspecified: Secondary | ICD-10-CM

## 2013-12-01 DIAGNOSIS — B029 Zoster without complications: Secondary | ICD-10-CM | POA: Diagnosis present

## 2013-12-01 DIAGNOSIS — Z5189 Encounter for other specified aftercare: Secondary | ICD-10-CM

## 2013-12-01 DIAGNOSIS — Z888 Allergy status to other drugs, medicaments and biological substances status: Secondary | ICD-10-CM | POA: Diagnosis not present

## 2013-12-01 DIAGNOSIS — N390 Urinary tract infection, site not specified: Secondary | ICD-10-CM | POA: Diagnosis present

## 2013-12-01 DIAGNOSIS — I2510 Atherosclerotic heart disease of native coronary artery without angina pectoris: Secondary | ICD-10-CM | POA: Diagnosis present

## 2013-12-01 DIAGNOSIS — R4182 Altered mental status, unspecified: Secondary | ICD-10-CM | POA: Diagnosis present

## 2013-12-01 DIAGNOSIS — E861 Hypovolemia: Secondary | ICD-10-CM | POA: Diagnosis present

## 2013-12-01 DIAGNOSIS — Z794 Long term (current) use of insulin: Secondary | ICD-10-CM | POA: Diagnosis not present

## 2013-12-01 DIAGNOSIS — Z7982 Long term (current) use of aspirin: Secondary | ICD-10-CM | POA: Diagnosis not present

## 2013-12-01 DIAGNOSIS — Z7902 Long term (current) use of antithrombotics/antiplatelets: Secondary | ICD-10-CM | POA: Diagnosis not present

## 2013-12-01 DIAGNOSIS — I509 Heart failure, unspecified: Secondary | ICD-10-CM | POA: Diagnosis present

## 2013-12-01 DIAGNOSIS — I10 Essential (primary) hypertension: Secondary | ICD-10-CM | POA: Diagnosis present

## 2013-12-01 DIAGNOSIS — R55 Syncope and collapse: Secondary | ICD-10-CM | POA: Diagnosis present

## 2013-12-01 DIAGNOSIS — A498 Other bacterial infections of unspecified site: Secondary | ICD-10-CM | POA: Diagnosis present

## 2013-12-01 DIAGNOSIS — Z8249 Family history of ischemic heart disease and other diseases of the circulatory system: Secondary | ICD-10-CM | POA: Diagnosis not present

## 2013-12-01 DIAGNOSIS — T671XXA Heat syncope, initial encounter: Secondary | ICD-10-CM

## 2013-12-01 DIAGNOSIS — Z66 Do not resuscitate: Secondary | ICD-10-CM | POA: Diagnosis present

## 2013-12-01 DIAGNOSIS — M810 Age-related osteoporosis without current pathological fracture: Secondary | ICD-10-CM | POA: Diagnosis present

## 2013-12-01 DIAGNOSIS — Z9089 Acquired absence of other organs: Secondary | ICD-10-CM | POA: Diagnosis not present

## 2013-12-01 DIAGNOSIS — I252 Old myocardial infarction: Secondary | ICD-10-CM | POA: Diagnosis not present

## 2013-12-01 DIAGNOSIS — I517 Cardiomegaly: Secondary | ICD-10-CM

## 2013-12-01 DIAGNOSIS — Z880 Allergy status to penicillin: Secondary | ICD-10-CM | POA: Diagnosis not present

## 2013-12-01 DIAGNOSIS — E860 Dehydration: Secondary | ICD-10-CM | POA: Diagnosis present

## 2013-12-01 DIAGNOSIS — I5022 Chronic systolic (congestive) heart failure: Secondary | ICD-10-CM | POA: Diagnosis present

## 2013-12-01 DIAGNOSIS — L8991 Pressure ulcer of unspecified site, stage 1: Secondary | ICD-10-CM | POA: Diagnosis present

## 2013-12-01 DIAGNOSIS — E119 Type 2 diabetes mellitus without complications: Secondary | ICD-10-CM | POA: Diagnosis present

## 2013-12-01 LAB — CBC
HCT: 36.9 % (ref 36.0–46.0)
Hemoglobin: 12 g/dL (ref 12.0–15.0)
MCH: 29.7 pg (ref 26.0–34.0)
MCHC: 32.5 g/dL (ref 30.0–36.0)
MCV: 91.3 fL (ref 78.0–100.0)
Platelets: 215 10*3/uL (ref 150–400)
RBC: 4.04 MIL/uL (ref 3.87–5.11)
RDW: 16.5 % — ABNORMAL HIGH (ref 11.5–15.5)
WBC: 12.6 10*3/uL — ABNORMAL HIGH (ref 4.0–10.5)

## 2013-12-01 LAB — BASIC METABOLIC PANEL
Anion gap: 14 (ref 5–15)
BUN: 39 mg/dL — AB (ref 6–23)
CO2: 22 mEq/L (ref 19–32)
Calcium: 8.8 mg/dL (ref 8.4–10.5)
Chloride: 106 mEq/L (ref 96–112)
Creatinine, Ser: 1.17 mg/dL — ABNORMAL HIGH (ref 0.50–1.10)
GFR calc Af Amer: 47 mL/min — ABNORMAL LOW (ref >90)
GFR, EST NON AFRICAN AMERICAN: 41 mL/min — AB (ref >90)
GLUCOSE: 108 mg/dL — AB (ref 70–99)
POTASSIUM: 5.2 meq/L (ref 3.7–5.3)
Sodium: 142 mEq/L (ref 137–147)

## 2013-12-01 LAB — GLUCOSE, CAPILLARY
GLUCOSE-CAPILLARY: 189 mg/dL — AB (ref 70–99)
Glucose-Capillary: 140 mg/dL — ABNORMAL HIGH (ref 70–99)
Glucose-Capillary: 276 mg/dL — ABNORMAL HIGH (ref 70–99)
Glucose-Capillary: 291 mg/dL — ABNORMAL HIGH (ref 70–99)

## 2013-12-01 LAB — HEMOGLOBIN A1C
Hgb A1c MFr Bld: 8.3 % — ABNORMAL HIGH (ref <5.7)
Mean Plasma Glucose: 192 mg/dL — ABNORMAL HIGH (ref <117)

## 2013-12-01 MED ORDER — CIPROFLOXACIN HCL 500 MG PO TABS
500.0000 mg | ORAL_TABLET | Freq: Every day | ORAL | Status: DC
  Administered 2013-12-01: 500 mg via ORAL
  Filled 2013-12-01: qty 1

## 2013-12-01 MED ORDER — DULOXETINE HCL 60 MG PO CPEP
60.0000 mg | ORAL_CAPSULE | Freq: Every day | ORAL | Status: DC
  Administered 2013-12-01 – 2013-12-04 (×4): 60 mg via ORAL
  Filled 2013-12-01 (×4): qty 1

## 2013-12-01 MED ORDER — PNEUMOCOCCAL VAC POLYVALENT 25 MCG/0.5ML IJ INJ
0.5000 mL | INJECTION | INTRAMUSCULAR | Status: AC
  Administered 2013-12-01: 0.5 mL via INTRAMUSCULAR
  Filled 2013-12-01: qty 0.5

## 2013-12-01 MED ORDER — CIPROFLOXACIN IN D5W 400 MG/200ML IV SOLN
400.0000 mg | Freq: Two times a day (BID) | INTRAVENOUS | Status: DC
  Administered 2013-12-01 – 2013-12-03 (×4): 400 mg via INTRAVENOUS
  Filled 2013-12-01 (×5): qty 200

## 2013-12-01 MED ORDER — INSULIN ASPART 100 UNIT/ML ~~LOC~~ SOLN
0.0000 [IU] | Freq: Three times a day (TID) | SUBCUTANEOUS | Status: DC
  Administered 2013-12-02 (×2): 11 [IU] via SUBCUTANEOUS
  Administered 2013-12-02: 8 [IU] via SUBCUTANEOUS
  Administered 2013-12-03: 11 [IU] via SUBCUTANEOUS
  Administered 2013-12-03: 5 [IU] via SUBCUTANEOUS
  Administered 2013-12-04: 11 [IU] via SUBCUTANEOUS
  Administered 2013-12-04: 5 [IU] via SUBCUTANEOUS

## 2013-12-01 NOTE — Progress Notes (Signed)
Utilization review completed.  

## 2013-12-01 NOTE — Consult Note (Addendum)
ANTIBIOTIC CONSULT NOTE - INITIAL  Pharmacy Consult for ciprofloxacin Indication: UTI  Allergies  Allergen Reactions  . Lotensin [Benazepril Hcl]   . Penicillins   . Sulfa Antibiotics Rash    Patient Measurements: Height: 5\' 3"  (160 cm) Weight: 169 lb 9.6 oz (76.93 kg) IBW/kg (Calculated) : 52.4   Vital Signs: Temp: 98.6 F (37 C) (07/15 0510) Temp src: Oral (07/15 0510) BP: 121/68 mmHg (07/15 0510) Pulse Rate: 85 (07/15 0510) Intake/Output from previous day:   Intake/Output from this shift:    Labs:  Recent Labs  11/30/13 1212 12/01/13 0642  WBC 10.3 12.6*  HGB 13.0 12.0  PLT 201 215  CREATININE 1.76* 1.17*   Estimated Creatinine Clearance: 31.4 ml/min (by C-G formula based on Cr of 1.17). No results found for this basename: VANCOTROUGH, VANCOPEAK, VANCORANDOM, GENTTROUGH, GENTPEAK, GENTRANDOM, TOBRATROUGH, TOBRAPEAK, TOBRARND, AMIKACINPEAK, AMIKACINTROU, AMIKACIN,  in the last 72 hours   Microbiology: No results found for this or any previous visit (from the past 720 hour(s)).  Medical History: Past Medical History  Diagnosis Date  . Cataract   . Heart murmur   . Hypertension   . Myocardial infarction   . Osteoporosis   . Diabetes mellitus without complication     TYPE 2  . Arthritis     IN FINGERS & KNEE    Medications:  Prescriptions prior to admission  Medication Sig Dispense Refill  . acetaminophen (TYLENOL) 650 MG CR tablet Take 650 mg by mouth every 8 (eight) hours as needed for pain.      . Alpha-Lipoic Acid 600 MG CAPS Take 600 mg by mouth daily.       Marland Kitchen. aspirin 81 MG tablet Take 81 mg by mouth daily.      . carvedilol (COREG) 6.25 MG tablet Take 6.25 mg by mouth 2 (two) times daily with a meal.      . cholecalciferol (VITAMIN D) 400 UNITS TABS Take 2,000 Units by mouth daily.       . clopidogrel (PLAVIX) 75 MG tablet Take 75 mg by mouth daily.      . DULoxetine (CYMBALTA) 60 MG capsule Take 60 mg by mouth daily.      . fish oil-omega-3  fatty acids 1000 MG capsule Take 2 g by mouth daily.      . furosemide (LASIX) 20 MG tablet Take 20 mg by mouth 2 (two) times daily.      Marland Kitchen. glyBURIDE-metformin (GLUCOVANCE) 5-500 MG per tablet Take 2 tablets by mouth daily with breakfast.      . Multiple Vitamins-Minerals (MULTIVITAMIN WITH MINERALS) tablet Take 1 tablet by mouth daily.      . nitroGLYCERIN (NITROSTAT) 0.4 MG SL tablet Place 0.4 mg under the tongue every 5 (five) minutes as needed for chest pain.      . pravastatin (PRAVACHOL) 20 MG tablet Take 20 mg by mouth daily.      . sitaGLIPtin (JANUVIA) 100 MG tablet Take 100 mg by mouth daily.        Assessment: Patient is a [age] yof admitted with dehydration and AKI who was referred for a pharmacy consult for cipro to treat her potential UTI. Given age, will dose daily rather than BID.  Goal of Therapy:  Resolution of UTI symptoms  Plan:  Start cipro 500 mg po daily Expected duration 7 days of therapy F/u with culture results Pharmacy will sign off, re-consult if needed  Meaghann Choo E. Aquiles Ruffini, Pharm.D Clinical Pharmacy Resident Pager: [phone] 12/01/2013 10:08 AM

## 2013-12-01 NOTE — Progress Notes (Signed)
[facility]  2D Echocardiogram completed.  Maki Sweetser L Dennisha Mouser, RDCS 12/01/2013 3:36 PM

## 2013-12-01 NOTE — Evaluation (Signed)
Physical Therapy Evaluation Patient Details Name: Leslie ColtRachel B Delgado MRN: [id number] DOB: 23-Feb-1922 Today's Date: 12/01/2013   History of Present Illness  Patient is a 80 y/o female presents with syncopal episode and dehydration. Recently diagnosed with shingles 2 weeks ago and since then not eating or drinking. PMH positive for DM, HTN, MI, osteoporosis, heart murmur.    Clinical Impression  Patient presents with impaired cardiovascular endurance, balance deficits and impaired functional mobility. Pt's balance improved with use of RW vs quad cane. Pt not safe to return home alone. Daughter worried about mother living "[place] the middle of nowhere." Would benefit from ALF for long term placement to ensure safety and assist as needed. Pt would benefit from skilled therapy in acute setting to maximize independence and decrease fall risk so pt can return to PLOF.    Follow Up Recommendations SNF;Supervision for mobility/OOB (with transfer over to ALF.)    Equipment Recommendations  None recommended by PT    Recommendations for Other Services OT consult     Precautions / Restrictions Precautions Precautions: Fall Restrictions Weight Bearing Restrictions: No      Mobility  Bed Mobility               General bed mobility comments: Sitting EOB upon arrival.  Transfers Overall transfer level: Needs assistance Equipment used: Quad cane;Rolling walker (2 wheeled) Transfers: Sit to/from Stand Sit to Stand: Min guard         General transfer comment: Increased time to perform transfers.  Ambulation/Gait Ambulation/Gait assistance: Min guard Ambulation Distance (Feet): 100 Feet (100' + 2550' + 150' with 2 seated rest breaks.) Assistive device: Rolling walker (2 wheeled);Quad cane Gait Pattern/deviations: Step-through pattern;Trunk flexed Gait velocity: .54 ft/sec with wuad cane vs 1.1 ft/sec with use of RW. Gait velocity interpretation: Below normal speed for age/gender General Gait  Details: Gait speed and balance improved with use of RW vs quad cane.   Stairs Stairs: Yes Stairs assistance: Min assist Stair Management: One rail Right;One rail Left;With cane Number of Stairs: 6 General stair comments: 1 almost LOB descending steps.  Wheelchair Mobility    Modified Rankin (Stroke Patients Only)       Balance Overall balance assessment: Needs assistance Sitting-balance support: Feet supported;No upper extremity supported Sitting balance-Leahy Scale: Good     Standing balance support: Bilateral upper extremity supported;During functional activity Standing balance-Leahy Scale: Poor Standing balance comment: Use of cane vs RW for support. Needs UE support for dynamic activity.                             Pertinent Vitals/Pain SOB present during session requiring multiple seated rest breaks. Vitals stable throughout. Reports chronic back pain with static standing, repositioned.     Home Living Family/patient expects to be discharged to:: Private residence Living Arrangements: Alone Available Help at Discharge: Family;Available PRN/intermittently (Daughter lives 45 minutes away.) Type of Home: House Home Access: Stairs to enter Entrance Stairs-Rails: Doctor, general practiceight;Left Entrance Stairs-Number of Steps: 6 Home Layout: Two level Home Equipment: Environmental consultantWalker - 2 wheels;Cane - quad;Shower seat - built in      Prior Function Level of Independence: Independent with assistive device(s)         Comments: Pt reports performing ADLs independently with some difficulty. Does not do much cooking. Still drives.     Hand Dominance   Dominant Hand: Right    Extremity/Trunk Assessment   Upper Extremity Assessment: LUE deficits/detail  LUE Deficits / Details: Impaired AROM shoulder elevation due to prior injury otherwise [facility].   Lower Extremity Assessment: Overall WFL for tasks assessed         Communication   Communication: No difficulties   Cognition Arousal/Alertness: Awake/alert Behavior During Therapy: WFL for tasks assessed/performed Overall Cognitive Status: Within Functional Limits for tasks assessed                      General Comments      Exercises        Assessment/Plan    PT Assessment Patient needs continued PT services  PT Diagnosis Difficulty walking   PT Problem List Cardiopulmonary status limiting activity;Pain;Decreased activity tolerance;Decreased knowledge of use of DME;Decreased balance;Decreased safety awareness;Decreased mobility;Decreased knowledge of precautions  PT Treatment Interventions DME instruction;Balance training;Gait training;Neuromuscular re-education;Stair training;Functional mobility training;Patient/family education;Therapeutic activities;Therapeutic exercise   PT Goals (Current goals can be found in the Care Plan section) Acute Rehab PT Goals Patient Stated Goal: to go home to my own house. PT Goal Formulation: With patient Time For Goal Achievement: 12/15/13 Potential to Achieve Goals: Good    Frequency Min 3X/week   Barriers to discharge Decreased caregiver support Pt lives alone and has 6 steps to enter home.    Co-evaluation               End of Session Equipment Utilized During Treatment: Gait belt Activity Tolerance: Patient tolerated treatment well Patient left: in chair;with family/visitor present;with call bell/phone within reach Nurse Communication: Mobility status;Precautions    Functional Assessment Tool Used: Clinical judgment Functional Limitation: Mobility: Walking and moving around Mobility: Walking and Moving Around Current Status (W0981): At least 1 percent but less than 20 percent impaired, limited or restricted Mobility: Walking and Moving Around Goal Status [id number]): At least 1 percent but less than 20 percent impaired, limited or restricted    Time: 0830-0904 PT Time Calculation (min): 34 min   Charges:   PT  Evaluation $Initial PT Evaluation Tier I: 1 Procedure PT Treatments $Gait Training: 8-22 mins   PT G Codes:   Functional Assessment Tool Used: Clinical judgment Functional Limitation: Mobility: Walking and moving around    [place], [place] A 12/01/2013, 9:26 AM Alvie Heidelberg, PT, DPT [phone]

## 2013-12-01 NOTE — Progress Notes (Signed)
PATIENT DETAILS Name: Leslie Delgado Age: 80 y.o. Sex: female Date of Birth: June 17, 1921 Admit Date: 11/30/2013 Admitting Physician Penny Piarlando Vega, MD WUJ:WJXB,JYNWGNFPCP:PANG,RICHARD, MD  Subjective: Feeling better today. No complaints of pain, lightheadedness, dizziness, or N/V.  Worked with PT who stated patient showed no symptoms during their session.  Ate most of her breakfast this morning.  Assessment/Plan: Principal Problem:   Dehydration -Suspected cause of her syncopal episode. Hasn't been eating or drinking fluids since contracting Herpes Zoster virus 2 weeks ago.  Rash on side of face is almost completely resolved. -Maintenance IV fluids given   Syncope -Most likely due to above -Will continue to monitor blood pressures and get orthostatic vitals every shift -will hold Coreg for now   UTI -Postive nitrites, moderate leukocytes, many bacteria in UA. -Treating with ciprofloxacin 500mg , await Urine cs -this may have contributed to dehydration/weakness and poor appetite  Active Problems:   Acute renal failure -prerenal causes given history-improved -will continue to monitor serum creatinine after fluid administration.    Diabetes mellitus -Hold patient home regimen and placed on sliding scale insulin and diabetic diet -Monitor CBGs routinely  HTN -controlled with Coreg  Hx of CAD -no Chest Pain or SOB -managed medically-continue with Antiplatelets, Coreg, Statins  Disposition: Remain inpatient to monitor overnight for continued improvement in condition.  Plan to discharge tomorrow.   DVT Prophylaxis: Heparin SQ  Code Status: DNR  Family Communication Daughter at bedside wanting the patient to be sent to a nursing facility as patient lives alone.  Patient is not consenting to this and wants to be sent home.  Told family it depends on PT/OT recommendations and the consent of the patient in determining if this is the course that needs to be  taken.  Procedures:  None  CONSULTS:  None  Time spent 40 minutes-which includes 50% of the time with face-to-face with patient/ family and coordinating care related to the above assessment and plan.   MEDICATIONS: Scheduled Meds: . aspirin EC  81 mg Oral Daily  . carvedilol  3.125 mg Oral BID WC  . clopidogrel  75 mg Oral Daily  . feeding supplement (GLUCERNA SHAKE)  237 mL Oral BID BM  . heparin subcutaneous  5,000 Units Subcutaneous 3 times per day  . insulin aspart  0-5 Units Subcutaneous QHS  . insulin aspart  0-9 Units Subcutaneous TID WC  . omega-3 acid ethyl esters  2 g Oral Daily  . pneumococcal 23 valent vaccine  0.5 mL Intramuscular Tomorrow-1000  . simvastatin  10 mg Oral q1800   Continuous Infusions: . sodium chloride     PRN Meds:.acetaminophen, nitroGLYCERIN  Antibiotics: Anti-infectives   None       PHYSICAL EXAM: Vital signs in last 24 hours: Filed Vitals:   11/30/13 1558 11/30/13 1630 11/30/13 2101 12/01/13 0510  BP:  132/77 124/71 121/68  Pulse:  95 97 85  Temp: 98.1 F (36.7 C) 97.7 F (36.5 C) 98.4 F (36.9 C) 98.6 F (37 C)  TempSrc: Oral Oral Oral Oral  Resp:  18 20 18   Height:  5\' 3"  (1.6 m)    Weight:  77.293 kg (170 lb 6.4 oz)  76.93 kg (169 lb 9.6 oz)  SpO2:  99% 98% 97%    Weight change:  Filed Weights   11/30/13 1630 12/01/13 0510  Weight: 77.293 kg (170 lb 6.4 oz) 76.93 kg (169 lb 9.6 oz)   Body mass index is 30.05 kg/(m^2).  Gen Exam: Awake and alert with clear speech.  Sitting up in chair eating her breakfast Neck: Supple, No JVD.  Chest: B/L Clear.   CVS: S1 S2 Regular, no murmurs.  Abdomen: soft, BS +, non tender, non distended.  Extremities: no edema, lower extremities warm to touch. Neurologic: Non Focal.   Skin: resolving shingles rash on left side of face Wounds: N/A.    Intake/Output from previous day: No intake or output data in the 24 hours ending 12/01/13 0954   LAB RESULTS: CBC  Recent  Labs Lab 11/30/13 1212 12/01/13 0642  WBC 10.3 12.6*  HGB 13.0 12.0  HCT 39.0 36.9  PLT 201 215  MCV 89.2 91.3  MCH 29.7 29.7  MCHC 33.3 32.5  RDW 16.0* 16.5*  LYMPHSABS 1.4  --   MONOABS 0.8  --   EOSABS 0.1  --   BASOSABS 0.0  --     Chemistries   Recent Labs Lab 11/30/13 1212 11/30/13 1826 12/01/13 0642  NA 137  --  142  K 4.6  --  5.2  CL 99  --  106  CO2 21  --  22  GLUCOSE 315*  --  108*  BUN 54*  --  39*  CREATININE 1.76*  --  1.17*  CALCIUM 8.8  --  8.8  MG  --  2.2  --     CBG:  Recent Labs Lab 11/30/13 1756 11/30/13 1952 11/30/13 2207 12/01/13 0812  GLUCAP 473* 465* 267* 140*    GFR Estimated Creatinine Clearance: 31.4 ml/min (by C-G formula based on Cr of 1.17).  Coagulation profile No results found for this basename: INR, PROTIME,  in the last 168 hours  Cardiac Enzymes  Recent Labs Lab 11/30/13 1212  TROPONINI <0.30    No components found with this basename: POCBNP,  No results found for this basename: DDIMER,  in the last 72 hours  Recent Labs  11/30/13 1826  HGBA1C 8.3*   No results found for this basename: CHOL, HDL, LDLCALC, TRIG, CHOLHDL, LDLDIRECT,  in the last 72 hours No results found for this basename: TSH, T4TOTAL, FREET3, T3FREE, THYROIDAB,  in the last 72 hours No results found for this basename: VITAMINB12, FOLATE, FERRITIN, TIBC, IRON, RETICCTPCT,  in the last 72 hours No results found for this basename: LIPASE, AMYLASE,  in the last 72 hours  Urine Studies No results found for this basename: UACOL, UAPR, USPG, UPH, UTP, UGL, UKET, UBIL, UHGB, UNIT, UROB, ULEU, UEPI, UWBC, URBC, UBAC, CAST, CRYS, UCOM, BILUA,  in the last 72 hours  MICROBIOLOGY: No results found for this or any previous visit (from the past 240 hour(s)).  RADIOLOGY STUDIES/RESULTS: Dg Chest Portable 1 View  11/30/2013   CLINICAL DATA:  Altered mental status  EXAM: PORTABLE CHEST - 1 VIEW  COMPARISON:  Portable chest x-ray of November 04, 2010   FINDINGS: The lungs are well-expanded and clear. The cardiac silhouette is mildly enlarged. The pulmonary vascularity is not engorged. The mediastinum is normal. There is no pleural effusion or pneumothorax. There is minimal apical pleural thickening bilaterally. The bony thorax is unremarkable.  IMPRESSION: Mild stable cardiac enlargement without evidence of CHF or other acute cardiopulmonary abnormality.   Electronically Signed   By: David  [place]   On: 11/30/2013 12:40    Collene Leyden, PA-S, [facility]  [facility] Pager:[phone]  If 7PM-7AM, please contact night-coverage [email or web address] Password [email or web address] 12/01/2013, 9:54 AM   LOS: 1 day   **Disclaimer: This note  may have been dictated with voice recognition software. Similar sounding words can inadvertently be transcribed and this note may contain transcription errors which may not have been corrected upon publication of note.**  Attending Patient was seen, examined,treatment plan was discussed with the Physician extender. I have directly reviewed the clinical findings, lab, imaging studies and management of this patient in detail. I have made the necessary changes to the above noted documentation, and agree with the documentation, as recorded by the Physician extender.  Windell Norfolk MD [facility].

## 2013-12-01 NOTE — ED Provider Notes (Signed)
Medical screening examination/treatment/procedure(s) were conducted as a shared visit with non-physician practitioner(s) and myself.  I personally evaluated the patient during the encounter.   EKG Interpretation   Date/Time:  Tuesday November 30 2013 11:47:57 EDT Ventricular Rate:  78 PR Interval:  56 QRS Duration: 104 QT Interval:  578 QTC Calculation: 659 R Axis:   -63 Text Interpretation:  Sinus rhythm Short PR interval Left ventricular  hypertrophy Inferior infarct, old Anterior infarct, old Prolonged QT  interval No significant change since last tracing Confirmed by Maki Hege   MD, Cande Mastropietro ([id number]) on 11/30/2013 1:23:28 PM       Patient with multiple syncopal episodes. Likely dehydration given her labs. Will need hydration and observation.  Audree CamelScott T Morgon Pamer, MD 12/01/13 [phone]

## 2013-12-01 NOTE — Progress Notes (Signed)
INITIAL NUTRITION ASSESSMENT  DOCUMENTATION CODES Per approved criteria  -Obesity Unspecified   INTERVENTION: Continue Glucerna Shake po BID, each supplement provides 220 kcal and 10 grams of protein. Discussed importance of nutrition and hydration with patient and daughter. RD to continue to follow nutrition care plan.  NUTRITION DIAGNOSIS: Inadequate oral intake related to acute illness as evidenced by patient report.   Goal: Intake to meet >90% of estimated nutrition needs.  Monitor:  weight trends, lab trends, I/O's, PO intake, supplement tolerance  Reason for Assessment: MD Consult for Nutrition Assessment  80 y.o. female  Admitting Dx: Dehydration  ASSESSMENT: PMHx significant for HTN and DM. Admitted with poor po intake since dx of Herpes zoster approximately 2 weeks ago. Work-up reveals UTI.  Currently ordered for Carbohydrate Modified Medium diet. Pt states that she "forced" herself to eat this morning and was able to eat some eggs and a sausage patty. Pt takes Boost/Ensure products at home and has been taking them throughout the past month to supplement her poor oral intake. She is currently ordered for Glucerna Shakes. She states that her appetite has been poor for 3-4 weeks and has been on several rounds of antibiotics for various reasons - fever, dental work, and shingles. She now has a UTI.   Discussed importance of hydration, as well as nutrition intake. She reports that she doesn't keep up with her usual weight. Pt without significant fat or muscle wasting on exam.  CBG's: 140 - 465 Potassium, phosphorus and magnesium WNL  Height: Ht Readings from Last 1 Encounters:  11/30/13 5\' 3"  (1.6 m)    Weight: Wt Readings from Last 1 Encounters:  12/01/13 169 lb 9.6 oz (76.93 kg)    Ideal Body Weight: 115 lb  % Ideal Body Weight: 147%  Wt Readings from Last 10 Encounters:  12/01/13 169 lb 9.6 oz (76.93 kg)  11/19/13 175 lb (79.379 kg)  11/18/13 175 lb  (79.379 kg)  07/14/12 180 lb 9.6 oz (81.92 kg)    Usual Body Weight: 175 lb  % Usual Body Weight: 97%  BMI:  Body mass index is 30.05 kg/(m^2). Obese Class I  Estimated Nutritional Needs: Kcal: 1400 - 1550 Protein: 60 - 75 g Fluid: approx 1.5 liters daily  Skin: intact  Diet Order: Carb Control  EDUCATION NEEDS: -Education not appropriate at this time  No intake or output data in the 24 hours ending 12/01/13 1121  Last BM: PTA   Labs:   Recent Labs Lab 11/30/13 1212 11/30/13 1826 12/01/13 0642  NA 137  --  142  K 4.6  --  5.2  CL 99  --  106  CO2 21  --  22  BUN 54*  --  39*  CREATININE 1.76*  --  1.17*  CALCIUM 8.8  --  8.8  MG  --  2.2  --   PHOS  --  3.4  --   GLUCOSE 315*  --  108*    CBG (last 3)   Recent Labs  11/30/13 1952 11/30/13 2207 12/01/13 0812  GLUCAP 465* 267* 140*    Scheduled Meds: . aspirin EC  81 mg Oral Daily  . carvedilol  3.125 mg Oral BID WC  . ciprofloxacin  500 mg Oral Daily  . clopidogrel  75 mg Oral Daily  . DULoxetine  60 mg Oral Daily  . feeding supplement (GLUCERNA SHAKE)  237 mL Oral BID BM  . heparin subcutaneous  5,000 Units Subcutaneous 3 times per day  .  insulin aspart  0-5 Units Subcutaneous QHS  . insulin aspart  0-9 Units Subcutaneous TID WC  . omega-3 acid ethyl esters  2 g Oral Daily  . simvastatin  10 mg Oral q1800    Continuous Infusions: . sodium chloride      Past Medical History  Diagnosis Date  . Cataract   . Heart murmur   . Hypertension   . Myocardial infarction   . Osteoporosis   . Diabetes mellitus without complication     TYPE 2  . Arthritis     IN FINGERS & KNEE    Past Surgical History  Procedure Laterality Date  . Appendectomy    . Eye surgery    . Abdominal hysterectomy      Jarold MottoSamantha Eason Housman MS, RD, LDN Inpatient Registered Dietitian Pager: [phone] After-hours pager: [phone]

## 2013-12-02 DIAGNOSIS — I5022 Chronic systolic (congestive) heart failure: Secondary | ICD-10-CM

## 2013-12-02 LAB — GLUCOSE, CAPILLARY
Glucose-Capillary: 276 mg/dL — ABNORMAL HIGH (ref 70–99)
Glucose-Capillary: 311 mg/dL — ABNORMAL HIGH (ref 70–99)
Glucose-Capillary: 304 mg/dL — ABNORMAL HIGH (ref 70–99)
Glucose-Capillary: 309 mg/dL — ABNORMAL HIGH (ref 70–99)

## 2013-12-02 LAB — URINE CULTURE

## 2013-12-02 MED ORDER — INSULIN GLARGINE 100 UNIT/ML ~~LOC~~ SOLN
15.0000 [IU] | Freq: Every day | SUBCUTANEOUS | Status: DC
  Administered 2013-12-02: 15 [IU] via SUBCUTANEOUS
  Filled 2013-12-02 (×2): qty 0.15

## 2013-12-02 NOTE — Progress Notes (Signed)
Results for Signe ColtHECHT, Aarian B (MRN [id number]) as of 12/02/2013 12:51  Ref. Range 12/01/2013 14:30 12/01/2013 17:29 12/01/2013 21:10 12/02/2013 07:50 12/02/2013 12:12  Glucose-Capillary Latest Range: 70-99 mg/dL  409291 (H) 811189 (H) 914276 (H) 311 (H)  CBGs continue to be greater than 180 mg/dl.  Recommend adding Lantus 10 units daily if CBGs continue to be elevated.  Continue Novolog MODERATE correction scale TID & HS. Will continue to follow while in hospital.  Smith MinceKendra Hawley Pavia RN BSN CDE

## 2013-12-02 NOTE — Clinical Social Work Psychosocial (Signed)
Clinical Social Work Department BRIEF PSYCHOSOCIAL ASSESSMENT 12/02/2013  Patient:  Leslie Delgado, Leslie Delgado     Account Number:  [id number]     Admit date:  11/30/2013  Clinical Social Worker:  Lovey Newcomer  Date/Time:  12/02/2013 10:48 AM  Referred by:  Physician  Date Referred:  12/02/2013 Referred for  SNF Placement   Other Referral:   Interview type:  Patient Other interview type:   Patient alert and oriented at time of assessment.    PSYCHOSOCIAL DATA Living Status:  ALONE Admitted from facility:   Level of care:   Primary support name:  Ernie Avena Primary support relationship to patient:  CHILD, ADULT Degree of support available:   Support is good.    CURRENT CONCERNS Current Concerns  Post-Acute Placement   Other Concerns:    SOCIAL WORK ASSESSMENT / PLAN CSW met with patient at bedside to complete asessment. Patient is from home alone and family feels it is unsafe for patient to return home alone. Patient is agreeable to SNF placement and would like [facility] if possible. CSW explained SNF search/placement process and answered questions. Patient was initially apprehensive about SNF placement because she thought this would prevent her from going to her granddaughter's wedding on August 8th and her other granddaughter's return from the [facility] [facility] on July 26th. Patient reports that her son lives in the [place] area and her Daughter lives in [place].   Assessment/plan status:  Psychosocial Support/Ongoing Assessment of Needs Other assessment/ plan:   Complete Fl2, Fax, PASRR   Information/referral to community resources:   CSW contact information and SNF list given    PATIENT'S/FAMILY'S RESPONSE TO PLAN OF CARE: Patient agreeable to SNF placement at discharge. CSW will follow up with bed offers.       Liz Beach MSW, [place], [place], [phone]

## 2013-12-02 NOTE — Progress Notes (Signed)
PATIENT DETAILS Name: Leslie Delgado Age: 80 y.o. Sex: female Date of Birth: 04-17-1922 Admit Date: 11/30/2013 Admitting Physician Penny Pia, MD UEA:VWUJ,WJXBJYN, MD  Subjective: States she is doing well.  No complaints of pain, CP, SOB, lightheadedness/dizziness.  Was having trouble walking with walker when we came into the room.  Consenting to go to rehab before going home.   Principal Problem:  Dehydration  -Maintained of IV fluids.  Net I/O +350.4  Syncope  -Most likely due to above  -Will continue to monitor blood pressures and get orthostatic vitals every shift  -will hold Coreg for now   UTI  -This may have contributed to dehydration/weakness and poor appetite -Urine cs shows Ecoli, await sensitivity -Continue to treat with ciprofloxacin 500mg .  Chronic Systolic CHF -systolic function severely reduced.  Estimated EJ between 25%-30%. -Dr Jerral Ralph spoke with patient's cardiologist (Dr Jacinto Halim) and patient who agree that given the risks, this should continued to be managed pharmacologically. -currently clinicaly compensated, given soft BP hold off on starting ACEI/Beta Blockers   Active Problems:  Acute renal failure  -prerenal causes given history-improved  -will continue to monitor serum creatinine after fluid administration.   Diabetes mellitus  -Hold patient home regimen and placed on sliding scale insulin and diabetic diet. Given CBG's persistently elevated, will add low dose Lantus and follow  -Monitor CBGs routinely   HTN  -controlled without need for any anti-hypertensives at present  Hx of CAD  -no Chest Pain or SOB  -managed medically-continue with Antiplatelets,  Statins   Disposition:  Remain inpatient for now.  Will talk with SW to get patient into a rehab facility to get a stronger before she goes home.  Patient agreeable to the plan.  DVT Prophylaxis:  Heparin SQ   Code Status:  DNR   Family Communication  Daughter not at bedside at  the moment.  Procedures:  None   CONSULTS:  None   MEDICATIONS: Scheduled Meds: . aspirin EC  81 mg Oral Daily  . ciprofloxacin  400 mg Intravenous Q12H  . clopidogrel  75 mg Oral Daily  . DULoxetine  60 mg Oral Daily  . feeding supplement (GLUCERNA SHAKE)  237 mL Oral BID BM  . heparin subcutaneous  5,000 Units Subcutaneous 3 times per day  . insulin aspart  0-15 Units Subcutaneous TID WC  . insulin aspart  0-5 Units Subcutaneous QHS  . omega-3 acid ethyl esters  2 g Oral Daily  . simvastatin  10 mg Oral q1800   Continuous Infusions: . sodium chloride 10 mL/hr at 12/02/13 0813   PRN Meds:.acetaminophen, nitroGLYCERIN  Antibiotics: Anti-infectives   Start     Dose/Rate Route Frequency Ordered Stop   12/01/13 1200  ciprofloxacin (CIPRO) IVPB 400 mg     400 mg 200 mL/hr over 60 Minutes Intravenous Every 12 hours 12/01/13 1136     12/01/13 1100  ciprofloxacin (CIPRO) tablet 500 mg  Status:  Discontinued     500 mg Oral Daily 12/01/13 1001 12/01/13 1136       PHYSICAL EXAM: Vital signs in last 24 hours: Filed Vitals:   12/01/13 0510 12/01/13 1523 12/01/13 2057 12/02/13 0405  BP: 121/68 132/77 127/72 127/75  Pulse: 85 84 83 82  Temp: 98.6 F (37 C) 98.4 F (36.9 C) 98.8 F (37.1 C) 98.7 F (37.1 C)  TempSrc: Oral Oral Oral Oral  Resp: 18 18 18 18   Height:      Weight: 76.93  kg (169 lb 9.6 oz)     SpO2: 97%  96% 96%    Weight change:  Filed Weights   11/30/13 1630 12/01/13 0510  Weight: 77.293 kg (170 lb 6.4 oz) 76.93 kg (169 lb 9.6 oz)   Body mass index is 30.05 kg/(m^2).   Gen Exam: Awake and alert with clear speech. Sitting up in chair  Neck: Supple, No JVD.  Chest: B/L Clear.  CVS: S1 S2 Regular, no murmurs.  Abdomen: soft, BS +, non tender, non distended.  Extremities: no edema, lower extremities warm to touch.  Neurologic: Non Focal.  Skin: resolving shingles rash on left side of face  Wounds: N/A.   Intake/Output from previous  day:  Intake/Output Summary (Last 24 hours) at 12/02/13 0924 Last data filed at 12/02/13 0600  Gross per 24 hour  Intake 350.42 ml  Output      0 ml  Net 350.42 ml     LAB RESULTS: CBC  Recent Labs Lab 11/30/13 1212 12/01/13 0642  WBC 10.3 12.6*  HGB 13.0 12.0  HCT 39.0 36.9  PLT 201 215  MCV 89.2 91.3  MCH 29.7 29.7  MCHC 33.3 32.5  RDW 16.0* 16.5*  LYMPHSABS 1.4  --   MONOABS 0.8  --   EOSABS 0.1  --   BASOSABS 0.0  --     Chemistries   Recent Labs Lab 11/30/13 1212 11/30/13 1826 12/01/13 0642  NA 137  --  142  K 4.6  --  5.2  CL 99  --  106  CO2 21  --  22  GLUCOSE 315*  --  108*  BUN 54*  --  39*  CREATININE 1.76*  --  1.17*  CALCIUM 8.8  --  8.8  MG  --  2.2  --     CBG:  Recent Labs Lab 12/01/13 0812 12/01/13 1159 12/01/13 1729 12/01/13 2110 12/02/13 0750  GLUCAP 140* 276* 291* 189* 276*    GFR Estimated Creatinine Clearance: 31.4 ml/min (by C-G formula based on Cr of 1.17).  Coagulation profile No results found for this basename: INR, PROTIME,  in the last 168 hours  Cardiac Enzymes  Recent Labs Lab 11/30/13 1212  TROPONINI <0.30    No components found with this basename: POCBNP,  No results found for this basename: DDIMER,  in the last 72 hours  Recent Labs  11/30/13 1826  HGBA1C 8.3*   No results found for this basename: CHOL, HDL, LDLCALC, TRIG, CHOLHDL, LDLDIRECT,  in the last 72 hours No results found for this basename: TSH, T4TOTAL, FREET3, T3FREE, THYROIDAB,  in the last 72 hours No results found for this basename: VITAMINB12, FOLATE, FERRITIN, TIBC, IRON, RETICCTPCT,  in the last 72 hours No results found for this basename: LIPASE, AMYLASE,  in the last 72 hours  Urine Studies No results found for this basename: UACOL, UAPR, USPG, UPH, UTP, UGL, UKET, UBIL, UHGB, UNIT, UROB, ULEU, UEPI, UWBC, URBC, UBAC, CAST, CRYS, UCOM, BILUA,  in the last 72 hours  MICROBIOLOGY: Recent Results (from the past 240 hour(s))   URINE CULTURE     Status: None   Collection Time    11/30/13  2:51 PM      Result Value Ref Range Status   Specimen Description URINE, CLEAN CATCH   Final   Special Requests NONE   Final   Culture  Setup Time     Final   Value: 11/30/2013 15:22     Performed at [facility]   [place] Count     Final   Value: >=100,000 COLONIES/ML     Performed at [facility]   Culture     Final   Value: ESCHERICHIA COLI     Performed at [facility]   Report Status PENDING   Incomplete    RADIOLOGY STUDIES/RESULTS: Dg Chest Portable 1 View  11/30/2013   CLINICAL DATA:  Altered mental status  EXAM: PORTABLE CHEST - 1 VIEW  COMPARISON:  Portable chest x-ray of November 04, 2010  FINDINGS: The lungs are well-expanded and clear. The cardiac silhouette is mildly enlarged. The pulmonary vascularity is not engorged. The mediastinum is normal. There is no pleural effusion or pneumothorax. There is minimal apical pleural thickening bilaterally. The bony thorax is unremarkable.  IMPRESSION: Mild stable cardiac enlargement without evidence of CHF or other acute cardiopulmonary abnormality.   Electronically Signed   By: David  [place]   On: 11/30/2013 12:40    Collene Leydenariq, Waqas, PA-S, [facility]  [facility] Pager:[phone]  If 7PM-7AM, please contact night-coverage [email or web address] Password [email or web address] 12/02/2013, 9:24 AM   LOS: 2 days   **Disclaimer: This note may have been dictated with voice recognition software. Similar sounding words can inadvertently be transcribed and this note may contain transcription errors which may not have been corrected upon publication of note.**  Attending Patient was seen, examined,treatment plan was discussed with the Physician extender. I have directly reviewed the clinical findings, lab, imaging studies and management of this patient in detail. I have made the necessary changes to the above noted documentation, and agree with the documentation, as  recorded by the Physician extender.  Windell NorfolkS Ghimire MD [facility].

## 2013-12-02 NOTE — Clinical Social Work Note (Addendum)
Clinical Social Work Department CLINICAL SOCIAL WORK PLACEMENT NOTE 12/02/2013  Patient:  Leslie Delgado,Leslie Delgado  Account Number:  [id number] Admit date:  11/30/2013  Clinical Social Worker:  Cherre BlancJOSEPH BRYANT CAMPBELL, [place]  Date/time:  12/02/2013 02:28 PM  Clinical Social Work is seeking post-discharge placement for this patient at the following level of care:   SKILLED NURSING   (*CSW will update this form in Epic as items are completed)   12/02/2013  Patient/family provided with [facility] of Clinical Social Work's list of facilities offering this level of care within the geographic area requested by the patient (or if unable, by the patient's family).  12/02/2013  Patient/family informed of their freedom to choose among providers that offer the needed level of care, that participate in Medicare, Medicaid or managed care program needed by the patient, have an available bed and are willing to accept the patient.  12/02/2013  Patient/family informed of [facility]' ownership interest in [facility], as well as of the fact that they are under no obligation to receive care at this facility.  PASARR submitted to EDS on 12/02/2013 PASARR number received on 12/02/2013  FL2 transmitted to all facilities in geographic area requested by pt/family on  12/02/2013 FL2 transmitted to all facilities within larger geographic area on   Patient informed that his/her managed care company has contracts with or will negotiate with  certain facilities, including the following:     Patient/family informed of bed offers received:   Patient chooses bed at  Physician recommends and patient chooses bed at    Patient to be transferred to  on  12/04/13 Patient to be transferred to facility by Otila BackEmily Robertson (daughter) Patient and family notified of transfer on 12/04/13 Name of family member notified:  Otila Backmily Robertson (daughter)  The following physician request were entered in  Epic:   Additional Comments:   Roddie McBryant Campbell MSW, [place], [place], [phone]

## 2013-12-03 DIAGNOSIS — N390 Urinary tract infection, site not specified: Principal | ICD-10-CM

## 2013-12-03 LAB — GLUCOSE, CAPILLARY
GLUCOSE-CAPILLARY: 335 mg/dL — AB (ref 70–99)
Glucose-Capillary: 239 mg/dL — ABNORMAL HIGH (ref 70–99)
Glucose-Capillary: 423 mg/dL — ABNORMAL HIGH (ref 70–99)
Glucose-Capillary: 253 mg/dL — ABNORMAL HIGH (ref 70–99)

## 2013-12-03 MED ORDER — INSULIN ASPART 100 UNIT/ML ~~LOC~~ SOLN: SUBCUTANEOUS | Status: DC

## 2013-12-03 MED ORDER — CARVEDILOL 3.125 MG PO TABS
3.1250 mg | ORAL_TABLET | Freq: Two times a day (BID) | ORAL | Status: DC
  Administered 2013-12-03 – 2013-12-04 (×2): 3.125 mg via ORAL
  Filled 2013-12-03 (×4): qty 1

## 2013-12-03 MED ORDER — INSULIN GLARGINE 100 UNIT/ML ~~LOC~~ SOLN
20.0000 [IU] | Freq: Every day | SUBCUTANEOUS | Status: DC
  Administered 2013-12-03: 20 [IU] via SUBCUTANEOUS
  Filled 2013-12-03 (×2): qty 0.2

## 2013-12-03 MED ORDER — INSULIN ASPART 100 UNIT/ML ~~LOC~~ SOLN
20.0000 [IU] | Freq: Once | SUBCUTANEOUS | Status: AC
  Administered 2013-12-03: 20 [IU] via SUBCUTANEOUS

## 2013-12-03 MED ORDER — CARVEDILOL 3.125 MG PO TABS: 3.1250 mg | ORAL_TABLET | Freq: Two times a day (BID) | ORAL | Status: AC

## 2013-12-03 MED ORDER — CEFAZOLIN SODIUM 1 G IJ SOLR
500.0000 mg | Freq: Three times a day (TID) | INTRAMUSCULAR | Status: DC
  Administered 2013-12-03 – 2013-12-04 (×3): 500 mg via INTRAVENOUS
  Filled 2013-12-03 (×5): qty 5

## 2013-12-03 MED ORDER — CEPHALEXIN 500 MG PO CAPS: 500.0000 mg | ORAL_CAPSULE | Freq: Three times a day (TID) | ORAL | Status: DC

## 2013-12-03 NOTE — Discharge Summary (Addendum)
PATIENT DETAILS Name: Leslie Delgado Age: 80 y.o. Sex: female Date of Birth: 01-20-1922 MRN: [id number]. Admit Date: 11/30/2013 Admitting Physician: Penny Pia, MD WUJ:WJXB,JYNWGNF, MD  Recommendations for Outpatient Follow-up:  Please make sure patient has follow up with Dr Idelia Salm diagnoses of CHF Please slowly resume Lasix, and consider starting ACEI  PRIMARY DISCHARGE DIAGNOSIS:  Principal Problem:   Dehydration Active Problems:   Acute renal failure   Syncope      PAST MEDICAL HISTORY: Past Medical History  Diagnosis Date  . Cataract   . Heart murmur   . Hypertension   . Myocardial infarction   . Osteoporosis   . Diabetes mellitus without complication     TYPE 2  . Arthritis     IN FINGERS & KNEE    DISCHARGE MEDICATIONS:   Medication List    STOP taking these medications       furosemide 20 MG tablet  Commonly known as:  LASIX      TAKE these medications       acetaminophen 650 MG CR tablet  Commonly known as:  TYLENOL  Take 650 mg by mouth every 8 (eight) hours as needed for pain.     Alpha-Lipoic Acid 600 MG Caps  Take 600 mg by mouth daily.     aspirin 81 MG tablet  Take 81 mg by mouth daily.     carvedilol 3.125 MG tablet  Commonly known as:  COREG  Take 1 tablet (3.125 mg total) by mouth 2 (two) times daily with a meal.     cephALEXin 500 MG capsule  Commonly known as:  KEFLEX  Take 1 capsule (500 mg total) by mouth 3 (three) times daily. For 4 more days from 12/03/13     cholecalciferol 400 UNITS Tabs tablet  Commonly known as:  VITAMIN D  Take 2,000 Units by mouth daily.     clopidogrel 75 MG tablet  Commonly known as:  PLAVIX  Take 75 mg by mouth daily.     DULoxetine 60 MG capsule  Commonly known as:  CYMBALTA  Take 60 mg by mouth daily.     fish oil-omega-3 fatty acids 1000 MG capsule  Take 2 g by mouth daily.     glyBURIDE-metformin 5-500 MG per tablet  Commonly known as:  GLUCOVANCE  Take 2 tablets by mouth  daily with breakfast.     insulin aspart 100 UNIT/ML injection  Commonly known as:  novoLOG  - insulin aspart (novoLOG) injection 0-15 Units  - 0-15 Units, Subcutaneous, 3 times daily with meals  - CBG < 70: implement hypoglycemia protocol  - CBG 70 - 120: 0 units  - CBG 121 - 150: 2 units  - CBG 151 - 200: 3 units  - CBG 201 - 250: 5 units  - CBG 251 - 300: 8 units  - CBG 301 - 350: 11 units  - CBG 351 - 400: 15 units  - CBG > 400: call MD     multivitamin with minerals tablet  Take 1 tablet by mouth daily.     nitroGLYCERIN 0.4 MG SL tablet  Commonly known as:  NITROSTAT  Place 0.4 mg under the tongue every 5 (five) minutes as needed for chest pain.     pravastatin 20 MG tablet  Commonly known as:  PRAVACHOL  Take 20 mg by mouth daily.     sitaGLIPtin 100 MG tablet  Commonly known as:  JANUVIA  Take 100 mg by mouth daily.  ALLERGIES:   Allergies  Allergen Reactions  . Lotensin [Benazepril Hcl]   . Penicillins   . Sulfa Antibiotics Rash    BRIEF HPI:  See H&P, Labs, Consult and Test reports for all details in brief, patient was admitted for syncope  CONSULTATIONS:   None  PERTINENT RADIOLOGIC STUDIES: Dg Chest Portable 1 View  11/30/2013   CLINICAL DATA:  Altered mental status  EXAM: PORTABLE CHEST - 1 VIEW  COMPARISON:  Portable chest x-ray of November 04, 2010  FINDINGS: The lungs are well-expanded and clear. The cardiac silhouette is mildly enlarged. The pulmonary vascularity is not engorged. The mediastinum is normal. There is no pleural effusion or pneumothorax. There is minimal apical pleural thickening bilaterally. The bony thorax is unremarkable.  IMPRESSION: Mild stable cardiac enlargement without evidence of CHF or other acute cardiopulmonary abnormality.   Electronically Signed   By: David  [place]   On: 11/30/2013 12:40     PERTINENT LAB RESULTS: CBC:  Recent Labs  12/01/13 0642  WBC 12.6*  HGB 12.0  HCT 36.9  PLT 215    CMET CMP     Component Value Date/Time   NA 142 12/01/2013 0642   K 5.2 12/01/2013 0642   CL 106 12/01/2013 0642   CO2 22 12/01/2013 0642   GLUCOSE 108* 12/01/2013 0642   BUN 39* 12/01/2013 0642   CREATININE 1.17* 12/01/2013 0642   CALCIUM 8.8 12/01/2013 0642   PROT 6.3 11/30/2013 1212   ALBUMIN 3.3* 11/30/2013 1212   AST 25 11/30/2013 1212   ALT 39* 11/30/2013 1212   ALKPHOS 61 11/30/2013 1212   BILITOT 0.4 11/30/2013 1212   GFRNONAA 41* 12/01/2013 0642   GFRAA 47* 12/01/2013 0642    GFR Estimated Creatinine Clearance: 31.8 ml/min (by C-G formula based on Cr of 1.17). No results found for this basename: LIPASE, AMYLASE,  in the last 72 hours No results found for this basename: CKTOTAL, CKMB, CKMBINDEX, TROPONINI,  in the last 72 hours No components found with this basename: POCBNP,  No results found for this basename: DDIMER,  in the last 72 hours  Recent Labs  11/30/13 1826  HGBA1C 8.3*   No results found for this basename: CHOL, HDL, LDLCALC, TRIG, CHOLHDL, LDLDIRECT,  in the last 72 hours No results found for this basename: TSH, T4TOTAL, FREET3, T3FREE, THYROIDAB,  in the last 72 hours No results found for this basename: VITAMINB12, FOLATE, FERRITIN, TIBC, IRON, RETICCTPCT,  in the last 72 hours Coags: No results found for this basename: PT, INR,  in the last 72 hours Microbiology: Recent Results (from the past 240 hour(s))  URINE CULTURE     Status: None   Collection Time    11/30/13  2:51 PM      Result Value Ref Range Status   Specimen Description URINE, CLEAN CATCH   Final   Special Requests NONE   Final   Culture  Setup Time     Final   Value: 11/30/2013 15:22     Performed at [facility] Count     Final   Value: >=100,000 COLONIES/ML     Performed at [facility]   Culture     Final   Value: ESCHERICHIA COLI     Performed at [facility]   Report Status 12/02/2013 FINAL   Final   Organism ID, Bacteria ESCHERICHIA COLI   Final      BRIEF HOSPITAL COURSE:   Principal Problem:  Dehydration  -  resolved with IVF   Syncope  -Most likely due to above  -Orthostatic initially, now seems to have resolved  - Coreg held initially, will restart but a lowest dose.  -Tele-unremarkable, Echo showing significantly depressed EF. See below   UTI  -This may have contributed to dehydration/weakness and poor appetite  -Urine cs shows Ecoli, pansensitive, Day 3 of antibiotics, Day 1 of ancef. Will transition to Keflex on discharge   Chronic Systolic CHF  -systolic function severely reduced. Estimated EJ between 25%-30%.  -This MD spoke with patient's cardiologist (Dr Jacinto HalimGanji) and patient who agrees that no intervention required at this age, and to manage medically. Patient and daughter agreeable with this approach as well.  -currently clinicaly compensated, given soft BP initially held off on starting ACEI/Beta Blockers-however will retry to start Low dose Coreg. Will need to reassess on discharge/follow up with PCP/Cardsiologist regarding starting ACEI and Lasix. Currently BP to soft to start all these medications.  Acute renal failure  -prerenal causes given history-improved  -will continue to monitor serum creatinine after fluid administration.   Diabetes mellitus  -CBG's relative well controlled with Lantus/SSI-while patient.Resume usual oral hypoglycemic agents on discharge.  HTN  -controlled without need for any anti-hypertensives at present -however BP starting to come up-therefore will restart low dose Coreg  Hx of CAD  -no Chest Pain or SOB  -managed medically-continue with Antiplatelets, Statins and Coreg    TODAY-DAY OF DISCHARGE:  Subjective:   Leslie Delgado today has no headache,no chest abdominal pain,no new weakness tingling or numbness, feels much better wants to go home today.   Objective:   Blood pressure 136/80, pulse 99, temperature 98.4 F (36.9 C), temperature source Oral, resp. rate 18, height  5\' 3"  (1.6 m), weight 79.2 kg (174 lb 9.7 oz), SpO2 98.00%.  Intake/Output Summary (Last 24 hours) at 12/03/13 1510 Last data filed at 12/03/13 0900  Gross per 24 hour  Intake    830 ml  Output      0 ml  Net    830 ml   Filed Weights   11/30/13 1630 12/01/13 0510 12/03/13 0533  Weight: 77.293 kg (170 lb 6.4 oz) 76.93 kg (169 lb 9.6 oz) 79.2 kg (174 lb 9.7 oz)    Exam Awake Alert, Oriented *3, No new F.N deficits, Normal affect [place].AT,PERRAL Supple Neck,No JVD, No cervical lymphadenopathy appriciated.  Symmetrical Chest wall movement, Good air movement bilaterally, CTAB RRR,No Gallops,Rubs or new Murmurs, No Parasternal Heave +ve B.Sounds, Abd Soft, Non tender, No organomegaly appriciated, No rebound -guarding or rigidity. No Cyanosis, Clubbing or edema, No new Rash or bruise  DISCHARGE CONDITION: Stable  DISPOSITION: SNF   DISCHARGE INSTRUCTIONS:    Activity:  As tolerated with Full fall precautions use walker/cane & assistance as needed  Diet recommendation: Diabetic Diet Heart Healthy diet Fluid restriction 1.8 lit/day      Follow-up Information   Follow up with PANG,RICHARD, MD. Schedule an appointment as soon as possible for a visit in 1 week. (after discharge from SNF)    Specialty:  Internal Medicine   Contact information:   [address], [address] [place] [address] [phone]       Follow up with Pamella PertGANJI,JAGADEESH R, MD. Schedule an appointment as soon as possible for a visit in 1 week.   Specialty:  Cardiology   Contact information:   [address] [place] [address] [phone]      Total Time spent on discharge equals 45 minutes.  SignedJeoffrey Massed 12/03/2013 3:10 PM  **Disclaimer: This note may have been dictated with voice recognition software. Similar sounding words can inadvertently be transcribed and this note may contain transcription errors which may not have been corrected upon publication of  note.**

## 2013-12-03 NOTE — Clinical Social Work Note (Signed)
Daughter has completed paperwork with [facility]. Facility is prepared to take patient over weekend. Report left for weekend CSW.   Roddie McBryant Deja Kaigler MSW, [place], [place], [phone]

## 2013-12-03 NOTE — Progress Notes (Signed)
Physical Therapy Treatment Patient Details Name: Leslie ColtRachel B Delgado MRN: [id number] DOB: 1921-09-25 Today's Date: 12/03/2013    History of Present Illness Patient is a 80 y/o female presents with syncopal episode and dehydration. Recently diagnosed with shingles 2 weeks ago and since then not eating or drinking. PMH positive for DM, HTN, MI, osteoporosis, heart murmur.     PT Comments    Patient fatigued today requiring more frequent rest breaks during gait training. Decreased ambulation distance from prior session due to SOB. Education provided on rollator management and how to safely utilize brakes. VC required for safety. Continues to exhibit impaired cardiovascular endurance and balance deficits. Will continue to follow.   Follow Up Recommendations  SNF;Supervision for mobility/OOB     Equipment Recommendations  None recommended by PT    Recommendations for Other Services       Precautions / Restrictions Precautions Precautions: Fall Restrictions Weight Bearing Restrictions: No    Mobility  Bed Mobility               General bed mobility comments: Sitting in chair upon arrival.  Transfers Overall transfer level: Needs assistance Equipment used: 4-wheeled walker Transfers: Sit to/from Stand Sit to Stand: Min guard         General transfer comment: Stood x1 from recliner, x5 from rollator seat. VC for safety upon standing from rollator.  Ambulation/Gait Ambulation/Gait assistance: Min guard Ambulation Distance (Feet): 34 Feet (+16'+40'+70'+40' with 5 seated rest breaks.) Assistive device: 4-wheeled walker Gait Pattern/deviations: Step-through pattern;Decreased stride length;Trunk flexed   Gait velocity interpretation: Below normal speed for age/gender General Gait Details: Slow, steady gait. VC provided for how to lock/unlock brakes on rollator and to push rollator up to wall prior to sitting for safety. VC to remind pt on how to work brakes within  session.   Stairs         General stair comments: 1 almost LOB when turning with rollator.  Wheelchair Mobility    Modified Rankin (Stroke Patients Only)       Balance Overall balance assessment: Needs assistance Sitting-balance support: No upper extremity supported;Feet supported Sitting balance-Leahy Scale: Good     Standing balance support: Bilateral upper extremity supported;During functional activity Standing balance-Leahy Scale: Poor Standing balance comment: Use of rollator for support.                    Cognition Arousal/Alertness: Awake/alert Behavior During Therapy: WFL for tasks assessed/performed Overall Cognitive Status: Within Functional Limits for tasks assessed                      Exercises General Exercises - Lower Extremity Ankle Circles/Pumps: Both;10 reps;Seated Long Arc Quad: Both;10 reps;Seated Hip Flexion/Marching: Both;10 reps;Seated    General Comments        Pertinent Vitals/Pain 0/10 pain reported. HR ranged from 94-116 bpm during session. SOB present with gait training.    Home Living                      Prior Function            PT Goals (current goals can now be found in the care plan section) Progress towards PT goals: Progressing toward goals    Frequency  Min 2X/week    PT Plan Frequency needs to be updated    Co-evaluation             End of Session Equipment Utilized During Treatment: Gait belt  Activity Tolerance: Patient limited by fatigue Patient left: in chair;with call bell/phone within reach;with chair alarm set;with family/visitor present     Time: 1610-9604 PT Time Calculation (min): 30 min  Charges:  $Gait Training: 23-37 mins                    G CodesAlvie Heidelberg A 12-11-13, 1:04 PM Alvie Heidelberg, PT, DPT [phone]

## 2013-12-03 NOTE — Progress Notes (Signed)
PATIENT DETAILS Name: Leslie Delgado Age: 80 y.o. Sex: female Date of Birth: 05/06/1922 Admit Date: 11/30/2013 Admitting Physician Penny Pia, MD MWN:[id number], MD  Subjective: No major complaints.  Assessment/Plan: Principal Problem: Dehydration  -resolved with IVF  Syncope  -Most likely due to above  -Orthostatic initially, now seems to have resolved - Coreg held initially, will restart but a lowest dose. -Tele-unremarkable, Echo showing significantly depressed EF. See below  UTI  -This may have contributed to dehydration/weakness and poor appetite  -Urine cs shows Ecoli, pansensitive, Day 3 of antibiotics, Day 1 of ancef. Will transition to Keflex on discharge  Chronic Systolic CHF  -systolic function severely reduced. Estimated EJ between 25%-30%.  -This MD spoke with patient's cardiologist (Dr Jacinto Halim) and patient who agrees that no intervention required at this age, and to manage medically. Patient and daughter agreeable with this approach as well.  -currently clinicaly compensated, given soft BP initially held off on starting ACEI/Beta Blockers-however will retry to start Low dose Coreg.  Active Problems:  Acute renal failure  -prerenal causes given history-improved  -will continue to monitor serum creatinine after fluid administration.   Diabetes mellitus  -CBG's relative well controlled with Lantus/SSI-since Lantus just added 7/17-monitor for another 24 hours before adjusting.  HTN  -controlled without need for any anti-hypertensives at present -however BP starting to come up-therefore will restart low dose Core  Hx of CAD  -no Chest Pain or SOB  -managed medically-continue with Antiplatelets, Statins and Coreg  Disposition: Remain inpatient-SNF tomorrow  DVT Prophylaxis: Prophylactic Heparin   Code Status:  DNR  Family Communication Daughter at bedside  Procedures:  None  CONSULTS:  None  MEDICATIONS: Scheduled Meds: . aspirin  EC  81 mg Oral Daily  .  ceFAZolin (ANCEF) IV  500 mg Intravenous 3 times per day  . clopidogrel  75 mg Oral Daily  . DULoxetine  60 mg Oral Daily  . feeding supplement (GLUCERNA SHAKE)  237 mL Oral BID BM  . heparin subcutaneous  5,000 Units Subcutaneous 3 times per day  . insulin aspart  0-15 Units Subcutaneous TID WC  . insulin aspart  0-5 Units Subcutaneous QHS  . insulin glargine  15 Units Subcutaneous QHS  . omega-3 acid ethyl esters  2 g Oral Daily  . simvastatin  10 mg Oral q1800   Continuous Infusions: . sodium chloride 10 mL/hr at 12/02/13 2213   PRN Meds:.acetaminophen, nitroGLYCERIN  Antibiotics: Anti-infectives   Start     Dose/Rate Route Frequency Ordered Stop   12/03/13 1200  ceFAZolin (ANCEF) 500 mg in dextrose 5 % 50 mL IVPB     500 mg 110 mL/hr over 30 Minutes Intravenous 3 times per day 12/03/13 0954     12/01/13 1200  ciprofloxacin (CIPRO) IVPB 400 mg  Status:  Discontinued     400 mg 200 mL/hr over 60 Minutes Intravenous Every 12 hours 12/01/13 1136 12/03/13 0954   12/01/13 1100  ciprofloxacin (CIPRO) tablet 500 mg  Status:  Discontinued     500 mg Oral Daily 12/01/13 1001 12/01/13 1136       PHYSICAL EXAM: Vital signs in last 24 hours: Filed Vitals:   12/02/13 0405 12/02/13 2026 12/03/13 0404 12/03/13 0533  BP: 127/75 128/73 130/71   Pulse: 82 90 91   Temp: 98.7 F (37.1 C) 99.2 F (37.3 C) 98.6 F (37 C)   TempSrc: Oral Oral Oral   Resp: 18 96 91   Height:  Weight:    79.2 kg (174 lb 9.7 oz)  SpO2: 96% 96% 98%     Weight change:  Filed Weights   11/30/13 1630 12/01/13 0510 12/03/13 0533  Weight: 77.293 kg (170 lb 6.4 oz) 76.93 kg (169 lb 9.6 oz) 79.2 kg (174 lb 9.7 oz)   Body mass index is 30.94 kg/(m^2).   Gen Exam: Awake and alert with clear speech.  Neck: Supple, No JVD.   Chest: B/L Clear.   CVS: S1 S2 Regular, no murmurs.  Abdomen: soft, BS +, non tender, non distended.  Extremities: no edema, lower extremities warm to  touch. Neurologic: Non Focal.   Skin: No Rash.   Wounds: N/A.   Intake/Output from previous day:  Intake/Output Summary (Last 24 hours) at 12/03/13 1145 Last data filed at 12/03/13 0900  Gross per 24 hour  Intake   1070 ml  Output      0 ml  Net   1070 ml     LAB RESULTS: CBC  Recent Labs Lab 11/30/13 1212 12/01/13 0642  WBC 10.3 12.6*  HGB 13.0 12.0  HCT 39.0 36.9  PLT 201 215  MCV 89.2 91.3  MCH 29.7 29.7  MCHC 33.3 32.5  RDW 16.0* 16.5*  LYMPHSABS 1.4  --   MONOABS 0.8  --   EOSABS 0.1  --   BASOSABS 0.0  --     Chemistries   Recent Labs Lab 11/30/13 1212 11/30/13 1826 12/01/13 0642  NA 137  --  142  K 4.6  --  5.2  CL 99  --  106  CO2 21  --  22  GLUCOSE 315*  --  108*  BUN 54*  --  39*  CREATININE 1.76*  --  1.17*  CALCIUM 8.8  --  8.8  MG  --  2.2  --     CBG:  Recent Labs Lab 12/02/13 0750 12/02/13 1212 12/02/13 1729 12/02/13 2135 12/03/13 0803  GLUCAP 276* 311* 304* 309* 239*    GFR Estimated Creatinine Clearance: 31.8 ml/min (by C-G formula based on Cr of 1.17).  Coagulation profile No results found for this basename: INR, PROTIME,  in the last 168 hours  Cardiac Enzymes  Recent Labs Lab 11/30/13 1212  TROPONINI <0.30    No components found with this basename: POCBNP,  No results found for this basename: DDIMER,  in the last 72 hours  Recent Labs  11/30/13 1826  HGBA1C 8.3*   No results found for this basename: CHOL, HDL, LDLCALC, TRIG, CHOLHDL, LDLDIRECT,  in the last 72 hours No results found for this basename: TSH, T4TOTAL, FREET3, T3FREE, THYROIDAB,  in the last 72 hours No results found for this basename: VITAMINB12, FOLATE, FERRITIN, TIBC, IRON, RETICCTPCT,  in the last 72 hours No results found for this basename: LIPASE, AMYLASE,  in the last 72 hours  Urine Studies No results found for this basename: UACOL, UAPR, USPG, UPH, UTP, UGL, UKET, UBIL, UHGB, UNIT, UROB, ULEU, UEPI, UWBC, URBC, UBAC, CAST, CRYS,  UCOM, BILUA,  in the last 72 hours  MICROBIOLOGY: Recent Results (from the past 240 hour(s))  URINE CULTURE     Status: None   Collection Time    11/30/13  2:51 PM      Result Value Ref Range Status   Specimen Description URINE, CLEAN CATCH   Final   Special Requests NONE   Final   Culture  Setup Time     Final   Value: 11/30/2013 15:22  Performed at [facility]   Colony Count     Final   Value: >=100,000 COLONIES/ML     Performed at [facility]   Culture     Final   Value: ESCHERICHIA COLI     Performed at [facility]   Report Status 12/02/2013 FINAL   Final   Organism ID, Bacteria ESCHERICHIA COLI   Final    RADIOLOGY STUDIES/RESULTS: Dg Chest Portable 1 View  11/30/2013   CLINICAL DATA:  Altered mental status  EXAM: PORTABLE CHEST - 1 VIEW  COMPARISON:  Portable chest x-ray of November 04, 2010  FINDINGS: The lungs are well-expanded and clear. The cardiac silhouette is mildly enlarged. The pulmonary vascularity is not engorged. The mediastinum is normal. There is no pleural effusion or pneumothorax. There is minimal apical pleural thickening bilaterally. The bony thorax is unremarkable.  IMPRESSION: Mild stable cardiac enlargement without evidence of CHF or other acute cardiopulmonary abnormality.   Electronically Signed   By: David  [place]   On: 11/30/2013 12:40    Jeoffrey MassedGHIMIRE,Jewelz Ricklefs, MD  [facility] Pager:[phone]  If 7PM-7AM, please contact night-coverage [email or web address] Password [email or web address] 12/03/2013, 11:45 AM   LOS: 3 days   **Disclaimer: This note may have been dictated with voice recognition software. Similar sounding words can inadvertently be transcribed and this note may contain transcription errors which may not have been corrected upon publication of note.**

## 2013-12-03 NOTE — Care Management Note (Signed)
    Page 1 of 1   12/03/2013     11:46:26 AM CARE MANAGEMENT NOTE 12/03/2013  Patient:  Leslie Delgado,Leslie Delgado   Account Number:  [id number]  Date Initiated:  12/03/2013  Documentation initiated by:  Letha CapeAYLOR,Glennda Weatherholtz  Subjective/Objective Assessment:   dx dehydration, ftt  admit- lives alone.     Action/Plan:   Anticipated DC Date:  12/03/2013   Anticipated DC Plan:  SKILLED NURSING FACILITY  In-house referral  Clinical Social Worker      DC Planning Services  CM consult      Choice offered to / List presented to:             Status of service:  Completed, signed off Medicare Important Message given?  YES (If response is "NO", the following Medicare IM given date fields will be blank) Date Medicare IM given:  12/03/2013 Medicare IM given by:  Letha CapeAYLOR,Giovonni Poirier Date Additional Medicare IM given:   Additional Medicare IM given by:    Discharge Disposition:    Per UR Regulation:  Reviewed for med. necessity/level of care/duration of stay  If discussed at Long Length of Stay Meetings, dates discussed:    Comments:

## 2013-12-04 LAB — GLUCOSE, CAPILLARY
GLUCOSE-CAPILLARY: 246 mg/dL — AB (ref 70–99)
GLUCOSE-CAPILLARY: 306 mg/dL — AB (ref 70–99)

## 2013-12-04 NOTE — Progress Notes (Addendum)
Call[facility] and [facility] for [facility]. Was told by CSW that she did not need a report and had all the information she needed.  I called to verify that but phone went to voicemail. Gave my name and phone number to call back if report is needed. Patient is ready for discharge and daughter is to be picking up patient around 1:00pm to take to [facility].

## 2013-12-04 NOTE — Clinical Social Work Note (Signed)
CSW made aware by RN patient ready for d/c to [facility]. CSW contacted facility and spoke with [facility]. Per Nira Conn, she needed to verify patient's blood sugar was within normal range. Nira Conn was provided with patient's blood sugar level. Heather confirmed bed availability. CSW faxed updated d/c summary and provided RN with contact number for Nira Conn to provide report, as Nira Conn stated facility had all information needed for patient, when CSW inquired about number for RN to give report. CSW met with patient who was alert and oriented x4. CSW discussed d/c plan with patient who is agreeable to plan. CSW contacted patient's daughter Raquel Sarna, who is agreeable to plan and stated she will be arriving to [facility] in approximately 2 hours to transport patient to facility. CSW made RN Vincente Liberty) aware. No further needs. CSW signing off.   [place], [place] Weekend Clinical Social Worker [phone]

## 2013-12-08 NOTE — Clinical Social Work Placement (Signed)
Clinical Social Work Department CLINICAL SOCIAL WORK PLACEMENT NOTE 12/08/2013  Patient:  Leslie Delgado,Leslie Delgado  Account Number:  [id number] Admit date:  11/30/2013  Clinical Social Worker:  Cherre BlancJOSEPH BRYANT Katarina Riebe, [place]  Date/time:  12/02/2013 02:28 PM  Clinical Social Work is seeking post-discharge placement for this patient at the following level of care:   SKILLED NURSING   (*CSW will update this form in Epic as items are completed)   12/02/2013  Patient/family provided with [facility] Department of Clinical Social Work's list of facilities offering this level of care within the geographic area requested by the patient (or if unable, by the patient's family).  12/02/2013  Patient/family informed of their freedom to choose among providers that offer the needed level of care, that participate in Medicare, Medicaid or managed care program needed by the patient, have an available bed and are willing to accept the patient.  12/02/2013  Patient/family informed of [facility]' ownership interest in [facility], as well as of the fact that they are under no obligation to receive care at this facility.  PASARR submitted to EDS on 12/02/2013 PASARR number received on 12/02/2013  FL2 transmitted to all facilities in geographic area requested by pt/family on  12/02/2013 FL2 transmitted to all facilities within larger geographic area on   Patient informed that his/her managed care company has contracts with or will negotiate with  certain facilities, including the following:     Patient/family informed of bed offers received:  12/03/2013 [facility] [place] Physician recommends and patient chooses bed at    [facility] [place] on  12/04/2013 Patient to be transferred to facility by Daughter Patient and family notified of transfer on  Name of family member notified:    The following physician  request were entered in Epic:   Additional Comments: Patient DC'ed by weekend CSW. Roddie McBryant Alyssandra Hulsebus MSW, [place], [place], [phone]

## 2014-03-04 ENCOUNTER — Other Ambulatory Visit: Payer: Self-pay

## 2014-05-28 ENCOUNTER — Encounter (HOSPITAL_COMMUNITY): Payer: Self-pay | Admitting: Emergency Medicine

## 2014-05-28 ENCOUNTER — Emergency Department (INDEPENDENT_AMBULATORY_CARE_PROVIDER_SITE_OTHER)
Admission: EM | Admit: 2014-05-28 | Discharge: 2014-05-28 | Disposition: A | Discharge status: Home / Self Care | Payer: Medicare Other | Source: Home / Self Care | Attending: Emergency Medicine | Admitting: Emergency Medicine

## 2014-05-28 DIAGNOSIS — E1165 Type 2 diabetes mellitus with hyperglycemia: Secondary | ICD-10-CM

## 2014-05-28 LAB — POCT I-STAT, CHEM 8
BUN: 33 mg/dL — ABNORMAL HIGH (ref 6–23)
CREATININE: 1.1 mg/dL (ref 0.50–1.10)
Calcium, Ion: 1.18 mmol/L (ref 1.13–1.30)
Chloride: 99 mEq/L (ref 96–112)
Glucose, Bld: 372 mg/dL — ABNORMAL HIGH (ref 70–99)
HEMATOCRIT: 40 % (ref 36.0–46.0)
Hemoglobin: 13.6 g/dL (ref 12.0–15.0)
Potassium: 3.9 mmol/L (ref 3.5–5.1)
SODIUM: 136 mmol/L (ref 135–145)
TCO2: 24 mmol/L (ref 0–100)

## 2014-05-28 MED ORDER — INSULIN ASPART 100 UNIT/ML ~~LOC~~ SOLN
10.0000 [IU] | Freq: Once | SUBCUTANEOUS | Status: AC
  Administered 2014-05-28: 10 [IU] via SUBCUTANEOUS

## 2014-05-28 MED ORDER — INSULIN LISPRO 100 UNIT/ML (KWIKPEN): PEN_INJECTOR | SUBCUTANEOUS | Status: DC

## 2014-05-28 MED ORDER — INSULIN ASPART 100 UNIT/ML ~~LOC~~ SOLN
SUBCUTANEOUS | Status: AC
  Filled 2014-05-28: qty 1

## 2014-05-28 NOTE — ED Notes (Signed)
Pt family states that pt blood sugar was 500 and 446 on blood glucose check. Pt family was told to bring pt in to urgent care by pt PCP

## 2014-05-28 NOTE — Discharge Instructions (Signed)
For Toujeo, take as follows:    Fasting glucose less than 130 -- no insulin    130 -180 -- 10 units    180 - 250 -- 15 units    250 - 350 -- 20 units    Over 350 call MD.

## 2014-05-28 NOTE — ED Provider Notes (Signed)
Chief Complaint   Hyperglycemia   History of Present Illness   Leslie Delgado is a 81 year old female with diabetes for the past 33 years. She's been on insulin since July and followed by Dr. Ricki MillerPang. She was seen by Dr. Ricki MillerPang 4 days ago. He made some changes in her diabetes medicine. He stopped her glyburide and her Novolin sliding scale. She was placed instead on a long-acting insulin, Toujeo. Her dosage is 10 units in the morning for blood sugars above 130. This morning her daughter checked her blood sugar and it was found to be 538. She called Dr. Ricki MillerPang and he suggested another 10 units. Her sugars remained up throughout the day and right before coming here was 486. She denies any diabetic symptoms such as polyuria, polydipsia, dry mouth, or blurry vision. She's had no abdominal pain, nausea, or vomiting. She denies any hypoglycemic episodes recently and she's had no fever, chills, cough, shortness of breath, chest pain, or urinary symptoms, although she is just finishing up an antibiotic for UTI.  Review of Systems   Other than as noted above, the patient denies any of the following symptoms. Systemic:  No fever, chills, weight loss or gain. Eye:  No blurred vision or diplopia. Lungs:  No shortness of breath. Heart:  No chest pain, tightness, pressure, palpitation, dizziness, syncope, or edema. Abdomen:  No abdominal pain, nausea, vomiting or diarrrhea. Ext:  No pain, paresthesias, swelling, or ulcerations. Endocrine:  No polyuria or polydipsia. Skin:  No rash or itching. Neuro:  No focal weakness or numbness.  PMFSH   Past medical history, family history, social history, meds, and allergies were reviewed. She is allergic to Lotensin, penicillins, and sulfa antibiotics. She has a history of hypertension, myocardial infarction, osteoporosis, and diabetes. Current meds include aspirin, carvedilol, vitamin D, Plavix, Cymbalta, Januvia, and pravastatin.  Physical Examination    Vital  signs:  BP 157/55 mmHg  Pulse 73  Temp(Src) 98.8 F (37.1 C) (Oral)  Resp 16  SpO2 96% Gen:  Alert, oriented, in no distress. The patient appears alert, she is smiling, talkative, and in good spirits. Eye:  PERRL, full EOM, lids conjunctivas, and sclera unremarkable. ENT:  TMs and canals normal.  Mucous membranes moist.  No acetone odor.  Pharynx clear.   Neck:  Supple, full ROM, no adenopathy or tenderness.  No JVD. Lungs:  Clear to auscultation.  No wheezes, rales or rhonchi. Heart:  Regular rhythm.  No gallops or murmers. Abdomen:  Soft, flat, non-distended, nontener.  No hepato-splenomegaly or mass.  Bowel sounds normal.  No pulsatile midline mass or bruit. Ext:  No edema, pulses full.  No ulceration or skin lesions. Skin:  Clear, warm and dry.  No rash or lesions. Neuro:  Alert and oriented times 3.  No focal weakness.  Speech normal.  CNs intact.  Labs   Results for orders placed or performed during the hospital encounter of 05/28/14  I-STAT, chem 8  Result Value Ref Range   Sodium 136 135 - 145 mmol/L   Potassium 3.9 3.5 - 5.1 mmol/L   Chloride 99 96 - 112 mEq/L   BUN 33 (H) 6 - 23 mg/dL   Creatinine, Ser 1.611.10 0.50 - 1.10 mg/dL   Glucose, Bld 096372 (H) 70 - 99 mg/dL   Calcium, Ion 0.451.18 4.091.13 - 1.30 mmol/L   TCO2 24 0 - 100 mmol/L   Hemoglobin 13.6 12.0 - 15.0 g/dL   HCT 81.140.0 91.436.0 - 78.246.0 %  Course in Urgent Care Center   The following medications were given:  Medications  insulin aspart (novoLOG) injection 10 Units (10 Units Subcutaneous Given 05/28/14 1802)   Assessment   The encounter diagnosis was Type 2 diabetes mellitus with hyperglycemia.  Diabetes poorly controlled.  Plan     1.  Meds:  The following meds were prescribed:   Discharge Medication List as of 05/28/2014  5:59 PM    START taking these medications   Details  insulin lispro (HUMALOG KWIKPEN) 100 UNIT/ML KiwkPen Take before meals with following scale:      Less than 130--no insulin      130 -  180 -- 4 units      180 - 250 -- 8 units      250 - 350 --12 units      350 - 400 --16 units      Over 400 -- call MD, Print        2.  Patient Education/Counseling:  The patient was given appropriate handouts, self care instructions, and instructed in symptomatic relief.  Discussed diet, exercise, and weight loss.  She was given Humalog on the above sliding scale as well as to take her long-acting insulin in the morning on a sliding scale as well with no insulin for blood sugar of less than 130, 5 units for 130-180, 10 units for 180 to 250, 15 units for 250-350. And she is to call her M.D. if above 350.  3.  Follow up:  The patient was told to follow up here if no better in 3 to 4 days, or sooner if becoming worse in any way, and given some red flag symptoms such as fever, persistent vomiting, abdominal pain, chest pain, or shortness of breath which would prompt immediate return.  Follow up with Dr. Ricki Miller next week.      Reuben Likes, MD 05/28/14 [phone]

## 2014-06-08 ENCOUNTER — Emergency Department (HOSPITAL_COMMUNITY)
Admission: EM | Admit: 2014-06-08 | Discharge: 2014-06-09 | Disposition: A | Discharge status: Home / Self Care | Payer: Medicare Other | Source: Home / Self Care | Attending: Emergency Medicine | Admitting: Emergency Medicine

## 2014-06-08 ENCOUNTER — Encounter (HOSPITAL_COMMUNITY): Payer: Self-pay | Admitting: *Deleted

## 2014-06-08 ENCOUNTER — Emergency Department (HOSPITAL_COMMUNITY): Payer: Medicare Other

## 2014-06-08 DIAGNOSIS — N390 Urinary tract infection, site not specified: Secondary | ICD-10-CM | POA: Diagnosis present

## 2014-06-08 DIAGNOSIS — N183 Chronic kidney disease, stage 3 (moderate): Secondary | ICD-10-CM | POA: Diagnosis present

## 2014-06-08 DIAGNOSIS — E1122 Type 2 diabetes mellitus with diabetic chronic kidney disease: Secondary | ICD-10-CM | POA: Diagnosis present

## 2014-06-08 DIAGNOSIS — B964 Proteus (mirabilis) (morganii) as the cause of diseases classified elsewhere: Secondary | ICD-10-CM | POA: Diagnosis present

## 2014-06-08 DIAGNOSIS — Z8249 Family history of ischemic heart disease and other diseases of the circulatory system: Secondary | ICD-10-CM

## 2014-06-08 DIAGNOSIS — Z9181 History of falling: Secondary | ICD-10-CM

## 2014-06-08 DIAGNOSIS — R748 Abnormal levels of other serum enzymes: Secondary | ICD-10-CM | POA: Diagnosis present

## 2014-06-08 DIAGNOSIS — I951 Orthostatic hypotension: Secondary | ICD-10-CM | POA: Diagnosis present

## 2014-06-08 DIAGNOSIS — Z7982 Long term (current) use of aspirin: Secondary | ICD-10-CM

## 2014-06-08 DIAGNOSIS — G8929 Other chronic pain: Secondary | ICD-10-CM | POA: Diagnosis present

## 2014-06-08 DIAGNOSIS — E860 Dehydration: Secondary | ICD-10-CM | POA: Diagnosis not present

## 2014-06-08 DIAGNOSIS — I5022 Chronic systolic (congestive) heart failure: Secondary | ICD-10-CM | POA: Diagnosis present

## 2014-06-08 DIAGNOSIS — M549 Dorsalgia, unspecified: Secondary | ICD-10-CM | POA: Diagnosis present

## 2014-06-08 DIAGNOSIS — R296 Repeated falls: Secondary | ICD-10-CM | POA: Diagnosis present

## 2014-06-08 DIAGNOSIS — I252 Old myocardial infarction: Secondary | ICD-10-CM

## 2014-06-08 DIAGNOSIS — I129 Hypertensive chronic kidney disease with stage 1 through stage 4 chronic kidney disease, or unspecified chronic kidney disease: Secondary | ICD-10-CM | POA: Diagnosis present

## 2014-06-08 DIAGNOSIS — N179 Acute kidney failure, unspecified: Principal | ICD-10-CM | POA: Diagnosis present

## 2014-06-08 DIAGNOSIS — W1830XA Fall on same level, unspecified, initial encounter: Secondary | ICD-10-CM | POA: Diagnosis present

## 2014-06-08 DIAGNOSIS — W19XXXA Unspecified fall, initial encounter: Secondary | ICD-10-CM

## 2014-06-08 DIAGNOSIS — Z794 Long term (current) use of insulin: Secondary | ICD-10-CM

## 2014-06-08 DIAGNOSIS — I429 Cardiomyopathy, unspecified: Secondary | ICD-10-CM | POA: Diagnosis present

## 2014-06-08 DIAGNOSIS — Z7901 Long term (current) use of anticoagulants: Secondary | ICD-10-CM

## 2014-06-08 LAB — URINE MICROSCOPIC-ADD ON

## 2014-06-08 LAB — URINALYSIS, ROUTINE W REFLEX MICROSCOPIC
BILIRUBIN URINE: NEGATIVE
GLUCOSE, UA: NEGATIVE mg/dL
KETONES UR: 15 mg/dL — AB
Nitrite: NEGATIVE
PROTEIN: 100 mg/dL — AB
Specific Gravity, Urine: 1.018 (ref 1.005–1.030)
UROBILINOGEN UA: 0.2 mg/dL (ref 0.0–1.0)
pH: 7.5 (ref 5.0–8.0)

## 2014-06-08 NOTE — ED Notes (Signed)
Pt incontinent of urine. Family reports pt had a UTI two weeks ago and has recently finished antibiotic.

## 2014-06-08 NOTE — ED Provider Notes (Signed)
CSN: [id number]     Arrival date & time 06/08/14  1856 History   First MD Initiated Contact with Patient 06/08/14 1910     Chief Complaint  Patient presents with  . Fall     (Consider location/radiation/quality/duration/timing/severity/associated sxs/prior Treatment) HPI Leslie Delgado is a 81 y.o. female with a history of diabetes comes in for evaluation of fall. Patient states at approximately 5:30 PM this evening she fell and hit her right shin and the back of her head. EMS was alerted via Life Alert pt was wearing.  She denies losing consciousness. States she fell because she lost her balance and this is a frequent occurrence for her. She is accompanied by her daughter in the room who confirms patient frequently falls due to balance issues. Patient denies any symptoms at this time, no headache, change in vision, shortness of breath, chest pain, numbness or weakness, abdominal pain, nausea or vomiting, diarrhea or constipation. Denies any pain. No anticoagulation  Past Medical History  Diagnosis Date  . Cataract   . Heart murmur   . Hypertension   . Myocardial infarction   . Osteoporosis   . Diabetes mellitus without complication     TYPE 2  . Arthritis     IN FINGERS & KNEE   Past Surgical History  Procedure Laterality Date  . Appendectomy    . Eye surgery    . Abdominal hysterectomy     Family History  Problem Relation Age of Onset  . Heart disease Father   . Cancer Brother   . Heart disease Brother    History  Substance Use Topics  . Smoking status: Never Smoker   . Smokeless tobacco: Never Used  . Alcohol Use: No   OB History    No data available     Review of Systems  All other systems reviewed and are negative. A 10 point review of systems was completed and was negative except for pertinent positives and negatives as mentioned in the history of present illness      Allergies  Lotensin; Penicillins; Procaine; and Sulfa antibiotics  Home Medications    Prior to Admission medications   Medication Sig Start Date End Date Taking? Authorizing Provider  Alpha-Lipoic Acid 600 MG CAPS Take 600 mg by mouth daily.    Yes Historical Provider, MD  aspirin 81 MG tablet Take 81 mg by mouth daily.   Yes Historical Provider, MD  carvedilol (COREG) 3.125 MG tablet Take 1 tablet (3.125 mg total) by mouth 2 (two) times daily with a meal. 12/03/13  Yes Shanker Levora DredgeM Ghimire, MD  Cholecalciferol 2000 UNITS TABS Take 2,000 Units by mouth daily.   Yes Historical Provider, MD  clopidogrel (PLAVIX) 75 MG tablet Take 75 mg by mouth daily.   Yes Historical Provider, MD  DULoxetine (CYMBALTA) 60 MG capsule Take 60 mg by mouth daily.   Yes Historical Provider, MD  fish oil-omega-3 fatty acids 1000 MG capsule Take 1 g by mouth daily.    Yes Historical Provider, MD  furosemide (LASIX) 20 MG tablet Take 20 mg by mouth daily.   Yes Historical Provider, MD  Insulin Glargine 300 UNIT/ML SOPN Inject 25 Units into the skin daily.   Yes Historical Provider, MD  insulin lispro (HUMALOG KWIKPEN) 100 UNIT/ML KiwkPen Take before meals with following scale:      Less than 130--no insulin      130 - 180 -- 4 units      180 - 250 -- 8  units      250 - 350 --12 units      350 - 400 --16 units      Over 400 -- call MD 05/28/14  Yes Reuben Likes, MD  losartan (COZAAR) 25 MG tablet Take 25 mg by mouth daily.   Yes Historical Provider, MD  Multiple Vitamins-Minerals (MULTIVITAMIN WITH MINERALS) tablet Take 1 tablet by mouth daily.   Yes Historical Provider, MD  pravastatin (PRAVACHOL) 20 MG tablet Take 20 mg by mouth daily.   Yes Historical Provider, MD  sitaGLIPtin (JANUVIA) 100 MG tablet Take 100 mg by mouth daily.   Yes Historical Provider, MD  cephALEXin (KEFLEX) 500 MG capsule Take 1 capsule (500 mg total) by mouth 4 (four) times daily. 06/09/14   Sharlene Motts, PA-C  cholecalciferol (VITAMIN D) 400 UNITS TABS Take 2,000 Units by mouth daily.     Historical Provider, MD   nitroGLYCERIN (NITROSTAT) 0.4 MG SL tablet Place 0.4 mg under the tongue every 5 (five) minutes as needed for chest pain.    Historical Provider, MD   BP 125/72 mmHg  Pulse 87  Temp(Src) 98.3 F (36.8 C) (Oral)  Resp 21  SpO2 97% Physical Exam  Constitutional: She is oriented to person, place, and time. She appears well-developed and well-nourished.  HENT:  Head: Normocephalic and atraumatic.  Mouth/Throat: Oropharynx is clear and moist.  Left-sided occipital hematoma with small skin tear. Hemostasis achieved PTA. No other obvious lesions, depressions or crepitus noted. No battle sign or raccoon eyes. Extraocular movements intact, no nystagmus  Eyes: Conjunctivae and EOM are normal. Pupils are equal, round, and reactive to light. Right eye exhibits no discharge. Left eye exhibits no discharge. No scleral icterus.  Neck: Normal range of motion. Neck supple. No JVD present.  No cervical tenderness.  Cardiovascular: Normal rate, regular rhythm, normal heart sounds and intact distal pulses.   Pulmonary/Chest: Effort normal and breath sounds normal. No respiratory distress. She has no wheezes. She has no rales.  Abdominal: Soft. She exhibits no distension. There is no tenderness. There is no rebound and no guarding.  Musculoskeletal: Normal range of motion. She exhibits no edema or tenderness.  Maintains full active range of motion of all 4 extremities.  Neurological: She is alert and oriented to person, place, and time.  Cranial Nerves II-XII grossly intact. Motor and sensation 5/5 in all 4 extremities. Completes finger to nose hand movements without difficulty. No nystagmus.  Skin: Skin is warm and dry. No rash noted.  Small skin tear to anterior aspect of R mid tibia.  Psychiatric: She has a normal mood and affect.  Nursing note and vitals reviewed.   ED Course  Procedures (including critical care time) Labs Review Labs Reviewed  URINALYSIS, ROUTINE W REFLEX MICROSCOPIC -  Abnormal; Notable for the following:    APPearance TURBID (*)    Hgb urine dipstick SMALL (*)    Ketones, ur 15 (*)    Protein, ur 100 (*)    Leukocytes, UA LARGE (*)    All other components within normal limits  URINE MICROSCOPIC-ADD ON - Abnormal; Notable for the following:    Bacteria, UA MANY (*)    Crystals TRIPLE PHOSPHATE CRYSTALS (*)    All other components within normal limits  URINE CULTURE    Imaging Review Ct Head Wo Contrast  06/08/2014   CLINICAL DATA:  Frequent falls, fall today at home. RIGHT forehead hematoma.  EXAM: CT HEAD WITHOUT CONTRAST  TECHNIQUE: Contiguous axial images  were obtained from the base of the skull through the vertex without intravenous contrast.  COMPARISON:  CT of the head June 03, 2009  FINDINGS: The ventricles and sulci are normal for age. No intraparenchymal hemorrhage, mass effect nor midline shift. Patchy supratentorial white matter hypodensities are within normal range for patient's age and though non-specific suggest sequelae of chronic small vessel ischemic disease. No acute large vascular territory infarcts. LEFT posterior frontal encephalomalacia, present on prior examination.  No abnormal extra-axial fluid collections. Basal cisterns are patent. Mild calcific atherosclerosis of the carotid siphons and included vertebral arteries.  Moderate LEFT parietal scalp hematoma. No skull fracture. The included ocular globes and orbital contents are non-suspicious. Bilateral scleral banding. The mastoid aircells and included paranasal sinuses are well-aerated.  IMPRESSION: Moderate LEFT parietal scalp hematoma. No skull fracture. No acute intracranial process.  Involutional changes. LEFT frontal encephalomalacia may represent remote middle cerebral artery territory infarct. Mild to moderate white matter changes can be seen with chronic small vessel ischemic disease.   Electronically Signed   By: Awilda Metro   On: 06/08/2014 21:53     EKG  Interpretation None       Meds given in ED:  Medications - No data to display  Discharge Medication List as of 06/09/2014 12:05 AM     Filed Vitals:   06/08/14 1930 06/08/14 2100 06/08/14 2339 06/09/14 0006  BP: 126/59 124/97 142/44 125/72  Pulse: 77 82 89 87  Temp:    98.3 F (36.8 C)  TempSrc:    Oral  Resp:  SpO2: 95% 96% 96% 97%    MDM  Vitals stable - WNL -afebrile Pt resting comfortably in ED. PE--normal neuro exam.  Labwork--shows evidence of UTI. Imaging--CT head shows moderate left parietal scalp hematoma with no other intracranial abnormality. Potential remote middle cerebral artery infarct  Will DC with Keflex. No evidence of other acute or emergent pathology. Pt with family in room who have agreed to stay with her until permanent arrangements can be made. I discussed all relevant lab findings and imaging results with pt and they verbalized understanding. Discussed f/u with PCP within 48 hrs and return precautions, pt very amenable to plan.  Prior to patient discharge, I discussed and reviewed this case with Dr.Kohut, who also saw and evaluated the pt.   Final diagnoses:  Fall, initial encounter        Sharlene Motts, PA-C 06/09/14 1109  Raeford Razor, MD 06/10/14 [id number]

## 2014-06-08 NOTE — ED Notes (Addendum)
Pt to ED from home via [facility] c/o fall. Pt lives alone and reports frequent falls. Life alert called EMS for pt. Pt has hematoma to R posterior head and skin tear to R skin. EMS VS bp 122/60; hr 80; resp 16; spO2 96% RA; CBG 191

## 2014-06-08 NOTE — ED Notes (Signed)
Pt reports using a walker at home when she lost her balance and fell. Reports frequent falls at home. Pt lives alone, has a lifealert button. Hematoma noted to posterior head and skin tear to R shin. Denies complaints at this time

## 2014-06-08 NOTE — ED Notes (Signed)
Attempted to collect a urine sample with the patient on the bedpan. Patient's brief's were full of urine when removing her pants. Patient reports that she is not always able to control her bladder but would still like to try to use the bedpan. Placed patient on the bedpan and she was unable to urinate at this time. Told patient that we can try again in a bit but if we aren't able to get it this way we will probably need to use an in and out catheter. Cleaned patient with peri-care and placed her on new chux. Patient reports having sores under her abdominal fold, cleaned patients fold and applied moisture barrier to the area.

## 2014-06-09 ENCOUNTER — Emergency Department (HOSPITAL_COMMUNITY): Payer: Medicare Other

## 2014-06-09 ENCOUNTER — Encounter (HOSPITAL_COMMUNITY): Payer: Self-pay | Admitting: Emergency Medicine

## 2014-06-09 ENCOUNTER — Inpatient Hospital Stay (HOSPITAL_COMMUNITY)
Admission: EM | Admit: 2014-06-09 | Discharge: 2014-06-13 | DRG: 683 | Disposition: A | Discharge status: Skilled Nursing Facility | Payer: Medicare Other | Attending: Internal Medicine | Admitting: Internal Medicine

## 2014-06-09 DIAGNOSIS — N183 Chronic kidney disease, stage 3 unspecified: Secondary | ICD-10-CM | POA: Diagnosis present

## 2014-06-09 DIAGNOSIS — R778 Other specified abnormalities of plasma proteins: Secondary | ICD-10-CM | POA: Diagnosis not present

## 2014-06-09 DIAGNOSIS — E1129 Type 2 diabetes mellitus with other diabetic kidney complication: Secondary | ICD-10-CM | POA: Diagnosis present

## 2014-06-09 DIAGNOSIS — IMO0002 Reserved for concepts with insufficient information to code with codable children: Secondary | ICD-10-CM | POA: Diagnosis present

## 2014-06-09 DIAGNOSIS — E1165 Type 2 diabetes mellitus with hyperglycemia: Secondary | ICD-10-CM

## 2014-06-09 DIAGNOSIS — N390 Urinary tract infection, site not specified: Secondary | ICD-10-CM | POA: Diagnosis present

## 2014-06-09 DIAGNOSIS — R7989 Other specified abnormal findings of blood chemistry: Secondary | ICD-10-CM | POA: Diagnosis not present

## 2014-06-09 DIAGNOSIS — R931 Abnormal findings on diagnostic imaging of heart and coronary circulation: Secondary | ICD-10-CM

## 2014-06-09 DIAGNOSIS — E860 Dehydration: Secondary | ICD-10-CM | POA: Diagnosis present

## 2014-06-09 DIAGNOSIS — R55 Syncope and collapse: Secondary | ICD-10-CM | POA: Diagnosis present

## 2014-06-09 DIAGNOSIS — I959 Hypotension, unspecified: Secondary | ICD-10-CM | POA: Diagnosis present

## 2014-06-09 DIAGNOSIS — I5022 Chronic systolic (congestive) heart failure: Secondary | ICD-10-CM | POA: Diagnosis present

## 2014-06-09 LAB — CBC WITH DIFFERENTIAL/PLATELET
Basophils Absolute: 0 10*3/uL (ref 0.0–0.1)
Basophils Relative: 0 % (ref 0–1)
EOS PCT: 1 % (ref 0–5)
Eosinophils Absolute: 0.1 10*3/uL (ref 0.0–0.7)
HEMATOCRIT: 35.4 % — AB (ref 36.0–46.0)
Hemoglobin: 11.9 g/dL — ABNORMAL LOW (ref 12.0–15.0)
LYMPHS PCT: 8 % — AB (ref 12–46)
Lymphs Abs: 1.3 10*3/uL (ref 0.7–4.0)
MCH: 28.7 pg (ref 26.0–34.0)
MCHC: 33.6 g/dL (ref 30.0–36.0)
MCV: 85.5 fL (ref 78.0–100.0)
MONOS PCT: 9 % (ref 3–12)
Monocytes Absolute: 1.5 10*3/uL — ABNORMAL HIGH (ref 0.1–1.0)
NEUTROS ABS: 13.4 10*3/uL — AB (ref 1.7–7.7)
Neutrophils Relative %: 82 % — ABNORMAL HIGH (ref 43–77)
PLATELETS: 212 10*3/uL (ref 150–400)
RBC: 4.14 MIL/uL (ref 3.87–5.11)
RDW: 14.5 % (ref 11.5–15.5)
WBC: 16.3 10*3/uL — ABNORMAL HIGH (ref 4.0–10.5)

## 2014-06-09 LAB — COMPREHENSIVE METABOLIC PANEL
ALBUMIN: 3 g/dL — AB (ref 3.5–5.2)
ALK PHOS: 60 U/L (ref 39–117)
ALT: 46 U/L — AB (ref 0–35)
ANION GAP: 9 (ref 5–15)
AST: 34 U/L (ref 0–37)
BUN: 50 mg/dL — ABNORMAL HIGH (ref 6–23)
CO2: 24 mmol/L (ref 19–32)
Calcium: 9.7 mg/dL (ref 8.4–10.5)
Chloride: 106 mEq/L (ref 96–112)
Creatinine, Ser: 1.74 mg/dL — ABNORMAL HIGH (ref 0.50–1.10)
GFR calc Af Amer: 29 mL/min — ABNORMAL LOW (ref >90)
GFR, EST NON AFRICAN AMERICAN: 25 mL/min — AB (ref >90)
Glucose, Bld: 75 mg/dL (ref 70–99)
Potassium: 3.7 mmol/L (ref 3.5–5.1)
Sodium: 139 mmol/L (ref 135–145)
TOTAL PROTEIN: 5.8 g/dL — AB (ref 6.0–8.3)
Total Bilirubin: 0.4 mg/dL (ref 0.3–1.2)

## 2014-06-09 LAB — TSH: TSH: 0.662 u[IU]/mL (ref 0.350–4.500)

## 2014-06-09 LAB — I-STAT TROPONIN, ED: Troponin i, poc: 0.01 ng/mL (ref 0.00–0.08)

## 2014-06-09 LAB — GLUCOSE, CAPILLARY: Glucose-Capillary: 73 mg/dL (ref 70–99)

## 2014-06-09 LAB — POC OCCULT BLOOD, ED: Fecal Occult Bld: NEGATIVE

## 2014-06-09 LAB — I-STAT CG4 LACTIC ACID, ED: Lactic Acid, Venous: 1.1 mmol/L (ref 0.5–2.0)

## 2014-06-09 LAB — TROPONIN I: TROPONIN I: 0.03 ng/mL (ref <0.031)

## 2014-06-09 MED ORDER — SODIUM CHLORIDE 0.9 % IJ SOLN
3.0000 mL | Freq: Two times a day (BID) | INTRAMUSCULAR | Status: DC
  Administered 2014-06-12 – 2014-06-13 (×3): 3 mL via INTRAVENOUS

## 2014-06-09 MED ORDER — CEPHALEXIN 500 MG PO CAPS: 500.0000 mg | ORAL_CAPSULE | Freq: Four times a day (QID) | ORAL | Status: DC

## 2014-06-09 MED ORDER — SODIUM CHLORIDE 0.9 % IV SOLN
INTRAVENOUS | Status: DC
  Administered 2014-06-11 – 2014-06-12 (×2): 75 mL/h via INTRAVENOUS

## 2014-06-09 MED ORDER — INSULIN GLARGINE 300 UNIT/ML ~~LOC~~ SOPN: 25.0000 [IU] | PEN_INJECTOR | Freq: Every day | SUBCUTANEOUS | Status: DC

## 2014-06-09 MED ORDER — VITAMIN B-1 100 MG PO TABS
100.0000 mg | ORAL_TABLET | Freq: Every day | ORAL | Status: DC
  Administered 2014-06-10 – 2014-06-13 (×4): 100 mg via ORAL
  Filled 2014-06-09 (×4): qty 1

## 2014-06-09 MED ORDER — NITROGLYCERIN 0.4 MG SL SUBL: 0.4000 mg | SUBLINGUAL_TABLET | SUBLINGUAL | Status: DC | PRN

## 2014-06-09 MED ORDER — CLOPIDOGREL BISULFATE 75 MG PO TABS
75.0000 mg | ORAL_TABLET | Freq: Every day | ORAL | Status: DC
  Administered 2014-06-10 – 2014-06-13 (×4): 75 mg via ORAL
  Filled 2014-06-09 (×4): qty 1

## 2014-06-09 MED ORDER — ACETAMINOPHEN 325 MG PO TABS
650.0000 mg | ORAL_TABLET | Freq: Four times a day (QID) | ORAL | Status: DC | PRN
  Administered 2014-06-11 – 2014-06-12 (×3): 650 mg via ORAL
  Filled 2014-06-09 (×4): qty 2

## 2014-06-09 MED ORDER — HEPARIN SODIUM (PORCINE) 5000 UNIT/ML IJ SOLN
5000.0000 [IU] | Freq: Three times a day (TID) | INTRAMUSCULAR | Status: DC
  Administered 2014-06-09 – 2014-06-13 (×11): 5000 [IU] via SUBCUTANEOUS
  Filled 2014-06-09 (×10): qty 1

## 2014-06-09 MED ORDER — ASPIRIN 81 MG PO CHEW
81.0000 mg | CHEWABLE_TABLET | Freq: Every day | ORAL | Status: DC
  Administered 2014-06-10 – 2014-06-13 (×4): 81 mg via ORAL
  Filled 2014-06-09 (×8): qty 1

## 2014-06-09 MED ORDER — SODIUM CHLORIDE 0.9 % IV BOLUS (SEPSIS)
500.0000 mL | Freq: Once | INTRAVENOUS | Status: AC
  Administered 2014-06-09: 500 mL via INTRAVENOUS

## 2014-06-09 MED ORDER — FOLIC ACID 1 MG PO TABS
1.0000 mg | ORAL_TABLET | Freq: Every day | ORAL | Status: DC
  Administered 2014-06-10 – 2014-06-13 (×4): 1 mg via ORAL
  Filled 2014-06-09 (×4): qty 1

## 2014-06-09 MED ORDER — ACETAMINOPHEN 650 MG RE SUPP: 650.0000 mg | Freq: Four times a day (QID) | RECTAL | Status: DC | PRN

## 2014-06-09 MED ORDER — PRAVASTATIN SODIUM 20 MG PO TABS
20.0000 mg | ORAL_TABLET | Freq: Every day | ORAL | Status: DC
  Administered 2014-06-10 – 2014-06-13 (×4): 20 mg via ORAL
  Filled 2014-06-09 (×4): qty 1

## 2014-06-09 MED ORDER — INSULIN ASPART 100 UNIT/ML ~~LOC~~ SOLN
0.0000 [IU] | Freq: Three times a day (TID) | SUBCUTANEOUS | Status: DC
  Administered 2014-06-10: 3 [IU] via SUBCUTANEOUS
  Administered 2014-06-10: 7 [IU] via SUBCUTANEOUS
  Administered 2014-06-11: 3 [IU] via SUBCUTANEOUS
  Administered 2014-06-11 (×2): 2 [IU] via SUBCUTANEOUS
  Administered 2014-06-12 (×2): 5 [IU] via SUBCUTANEOUS
  Administered 2014-06-12: 3 [IU] via SUBCUTANEOUS
  Administered 2014-06-13: 7 [IU] via SUBCUTANEOUS
  Administered 2014-06-13: 5 [IU] via SUBCUTANEOUS

## 2014-06-09 MED ORDER — DULOXETINE HCL 60 MG PO CPEP
60.0000 mg | ORAL_CAPSULE | Freq: Every day | ORAL | Status: DC
  Administered 2014-06-10 – 2014-06-13 (×4): 60 mg via ORAL
  Filled 2014-06-09 (×4): qty 1

## 2014-06-09 MED ORDER — LINAGLIPTIN 5 MG PO TABS
5.0000 mg | ORAL_TABLET | Freq: Every day | ORAL | Status: DC
  Administered 2014-06-10 – 2014-06-13 (×4): 5 mg via ORAL
  Filled 2014-06-09 (×4): qty 1

## 2014-06-09 MED ORDER — ONDANSETRON HCL 4 MG/2ML IJ SOLN: 4.0000 mg | Freq: Four times a day (QID) | INTRAMUSCULAR | Status: DC | PRN

## 2014-06-09 MED ORDER — ADULT MULTIVITAMIN W/MINERALS CH
1.0000 | ORAL_TABLET | Freq: Every day | ORAL | Status: DC
  Administered 2014-06-10 – 2014-06-13 (×4): 1 via ORAL
  Filled 2014-06-09 (×4): qty 1

## 2014-06-09 MED ORDER — ONDANSETRON HCL 4 MG PO TABS: 4.0000 mg | ORAL_TABLET | Freq: Four times a day (QID) | ORAL | Status: DC | PRN

## 2014-06-09 NOTE — ED Notes (Signed)
Pt placed into gown and on monitor upon arrival to room. Pt monitored by blood pressure, pulse ox, and 12 lead. pts EKG given to and signed by Dr. Freida BusmanAllen.

## 2014-06-09 NOTE — ED Provider Notes (Signed)
CSN: [id number]     Arrival date & time 06/09/14  1639 History   First MD Initiated Contact with Patient 06/09/14 1651     Chief Complaint  Patient presents with  . Loss of Consciousness     (Consider location/radiation/quality/duration/timing/severity/associated sxs/prior Treatment) HPI    PCP: PANG,RICHARD, MD Blood pressure 90/41, pulse 73, temperature 97.4 F (36.3 C), temperature source Oral, resp. rate 22, height 5\' 3"  (1.6 m), weight 170 lb (77.111 kg), SpO2 92 %.  Leslie Delgado is a 81 y.o.female with a significant PMH of cataract, balance issues with frequent falls, UTI (currently on abx), heart murmur, MI, diabetes, arthritis  presents to the ER with complaints of near syncopal episode.  She was getting out of the car-almost when almost completely upright, and per family member turned blue for 2-3 seconds. See was caught by family and did not fall to the ground. The daughter who is present for HPI says that the patient would shake her head up and down to answer questions after regaining conciousness. The episode lasted approx 5-10 minutes before she complete returned to baseline. She did not have any shaking or seizure like activity. The patient does not remember any of this happening and is surprised to be here in the ED. She denies headache, weakness (generalized or focal), blurred vision, SOB, CP, abdominal pains, no N/V/D. + UTI and urine incontinence, recently treated and failed first round abx.  + She also complains of hip pain, right hip after falling yesterday.  Past Medical History  Diagnosis Date  . Cataract   . Heart murmur   . Hypertension   . Myocardial infarction   . Osteoporosis   . Diabetes mellitus without complication     TYPE 2  . Arthritis     IN FINGERS & KNEE   Past Surgical History  Procedure Laterality Date  . Appendectomy    . Eye surgery    . Abdominal hysterectomy     Family History  Problem Relation Age of Onset  . Heart disease Father    . Cancer Brother   . Heart disease Brother    History  Substance Use Topics  . Smoking status: Never Smoker   . Smokeless tobacco: Never Used  . Alcohol Use: No   OB History    No data available     Review of Systems  10 Systems reviewed and are negative for acute change except as noted in the HPI.    Allergies  Lotensin; Penicillins; Procaine; and Sulfa antibiotics  Home Medications   Prior to Admission medications   Medication Sig Start Date End Date Taking? Authorizing Provider  Alpha-Lipoic Acid 600 MG CAPS Take 600 mg by mouth daily.    Yes Historical Provider, MD  aspirin 81 MG tablet Take 81 mg by mouth daily.   Yes Historical Provider, MD  carvedilol (COREG) 3.125 MG tablet Take 1 tablet (3.125 mg total) by mouth 2 (two) times daily with a meal. 12/03/13  Yes Shanker Levora Dredge, MD  Cholecalciferol 2000 UNITS TABS Take 2,000 Units by mouth daily.   Yes Historical Provider, MD  clopidogrel (PLAVIX) 75 MG tablet Take 75 mg by mouth daily.   Yes Historical Provider, MD  DULoxetine (CYMBALTA) 60 MG capsule Take 60 mg by mouth daily.   Yes Historical Provider, MD  fish oil-omega-3 fatty acids 1000 MG capsule Take 1 g by mouth daily.    Yes Historical Provider, MD  furosemide (LASIX) 20 MG tablet Take 20  mg by mouth daily.   Yes Historical Provider, MD  insulin lispro (HUMALOG KWIKPEN) 100 UNIT/ML KiwkPen Take before meals with following scale:      Less than 130--no insulin      130 - 180 -- 4 units      180 - 250 -- 8 units      250 - 350 --12 units      350 - 400 --16 units      Over 400 -- call MD 05/28/14  Yes Reuben Likes, MD  losartan (COZAAR) 25 MG tablet Take 25 mg by mouth daily.   Yes Historical Provider, MD  Multiple Vitamins-Minerals (MULTIVITAMIN WITH MINERALS) tablet Take 1 tablet by mouth daily.   Yes Historical Provider, MD  pravastatin (PRAVACHOL) 20 MG tablet Take 20 mg by mouth daily.   Yes Historical Provider, MD  sitaGLIPtin (JANUVIA) 100 MG tablet  Take 100 mg by mouth daily.   Yes Historical Provider, MD  cephALEXin (KEFLEX) 500 MG capsule Take 1 capsule (500 mg total) by mouth 4 (four) times daily. 06/09/14   Earle Gell Cartner, PA-C  Insulin Glargine 300 UNIT/ML SOPN Inject 25 Units into the skin daily.    Historical Provider, MD  nitroGLYCERIN (NITROSTAT) 0.4 MG SL tablet Place 0.4 mg under the tongue every 5 (five) minutes as needed for chest pain.    Historical Provider, MD   BP 122/49 mmHg  Pulse 82  Temp(Src) 97.4 F (36.3 C) (Oral)  Resp 23  Ht  (1.6 m)  Wt 170 lb (77.111 kg)  BMI 30.12 kg/m2  SpO2 98% Physical Exam  Constitutional: She appears well-developed and well-nourished. No distress.  HENT:  Head: Normocephalic and atraumatic. Head is without abrasion and without contusion.  Eyes: Pupils are equal, round, and reactive to light.  Neck: Normal range of motion. Neck supple.  Cardiovascular: Normal rate and regular rhythm.   Pulmonary/Chest: Effort normal.  Abdominal: Soft. There is no tenderness. There is no rigidity and no guarding.  Musculoskeletal:  Multiple areas of abrasions and ecchymosis to bilateral lower extremities.  Neurological: She is alert.  Cranial nerves II-VIII and X-XII evaluated and show no deficits. Pt alert and oriented x 3 Upper and lower extremity strength is symmetrical and physiologic Normal muscular tone No facial droop Coordination intact, no limb ataxia, finger-nose-finger normal  Skin: Skin is warm and dry.  Nursing note and vitals reviewed.   ED Course  Procedures (including critical care time) Labs Review Labs Reviewed  CBC WITH DIFFERENTIAL - Abnormal; Notable for the following:    WBC 16.3 (*)    Hemoglobin 11.9 (*)    HCT 35.4 (*)    Neutrophils Relative % 82 (*)    Neutro Abs 13.4 (*)    Lymphocytes Relative 8 (*)    Monocytes Absolute 1.5 (*)    All other components within normal limits  COMPREHENSIVE METABOLIC PANEL - Abnormal; Notable for the following:     BUN 50 (*)    Creatinine, Ser 1.74 (*)    Total Protein 5.8 (*)    Albumin 3.0 (*)    ALT 46 (*)    GFR calc non Af Amer 25 (*)    GFR calc Af Amer 29 (*)    All other components within normal limits  OCCULT BLOOD X 1 CARD TO LAB, STOOL  CBG MONITORING, ED  I-STAT TROPOININ, ED  I-STAT CG4 LACTIC ACID, ED  POC OCCULT BLOOD, ED    Imaging Review Dg Chest  2 View  06/09/2014   CLINICAL DATA:  81 year old female [place] near syncope  EXAM: CHEST  2 VIEW  COMPARISON:  Prior chest x-ray 11/30/2013  FINDINGS: Stable borderline cardiomegaly. Atherosclerotic calcifications present in the transverse aorta. Chronic bronchitic changes and interstitial prominence are similar compared to prior. No focal airspace consolidation, pulmonary edema, pleural effusion or pneumothorax. Stable remote left proximal humeral fracture and thoracolumbar vertebral body fractures. No acute osseous abnormality.  IMPRESSION: Stable chest x-ray without evidence for acute cardiopulmonary disease.   Electronically Signed   By: Malachy MoanHeath  McCullough M.D.   On: 06/09/2014 18:43   Ct Head Wo Contrast  06/09/2014   CLINICAL DATA:  Syncopal episode while sitting in the car.  EXAM: CT HEAD WITHOUT CONTRAST  TECHNIQUE: Contiguous axial images were obtained from the base of the skull through the vertex without intravenous contrast.  COMPARISON:  06/08/2014, 06/03/2009  FINDINGS: There is no evidence of mass effect, midline shift, or extra-axial fluid collections. There is no evidence of a space-occupying lesion or intracranial hemorrhage. There is no evidence of a cortical-based area of acute infarction. Left posterior frontal encephalomalacia likely related to prior insult. There is generalized cerebral atrophy. There is periventricular white matter low attenuation likely secondary to microangiopathy.  The ventricles and sulci are appropriate for the patient's age. The basal cisterns are patent.  Visualized portions of the orbits are  unremarkable. The visualized portions of the paranasal sinuses and mastoid air cells are unremarkable. Cerebrovascular atherosclerotic calcifications are noted.  The osseous structures are unremarkable. Small left parietal scalp hematoma.  IMPRESSION: 1. No acute intracranial pathology. 2. Chronic microvascular disease and cerebral atrophy.   Electronically Signed   By: Elige KoHetal  Patel   On: 06/09/2014 19:24   Ct Head Wo Contrast  06/08/2014   CLINICAL DATA:  Frequent falls, fall today at home. RIGHT forehead hematoma.  EXAM: CT HEAD WITHOUT CONTRAST  TECHNIQUE: Contiguous axial images were obtained from the base of the skull through the vertex without intravenous contrast.  COMPARISON:  CT of the head June 03, 2009  FINDINGS: The ventricles and sulci are normal for age. No intraparenchymal hemorrhage, mass effect nor midline shift. Patchy supratentorial white matter hypodensities are within normal range for patient's age and though non-specific suggest sequelae of chronic small vessel ischemic disease. No acute large vascular territory infarcts. LEFT posterior frontal encephalomalacia, present on prior examination.  No abnormal extra-axial fluid collections. Basal cisterns are patent. Mild calcific atherosclerosis of the carotid siphons and included vertebral arteries.  Moderate LEFT parietal scalp hematoma. No skull fracture. The included ocular globes and orbital contents are non-suspicious. Bilateral scleral banding. The mastoid aircells and included paranasal sinuses are well-aerated.  IMPRESSION: Moderate LEFT parietal scalp hematoma. No skull fracture. No acute intracranial process.  Involutional changes. LEFT frontal encephalomalacia may represent remote middle cerebral artery territory infarct. Mild to moderate white matter changes can be seen with chronic small vessel ischemic disease.   Electronically Signed   By: Awilda Metroourtnay  Bloomer   On: 06/08/2014 21:53     EKG Interpretation   Date/Time:   Thursday June 09 2014 16:47:55 EST Ventricular Rate:  74 PR Interval:  240 QRS Duration: 104 QT Interval:  368 QTC Calculation: 408 R Axis:   -68 Text Interpretation:  Sinus or ectopic atrial rhythm Ventricular premature  complex Prolonged PR interval Left ventricular hypertrophy Inferior  infarct, old Anterior infarct, old No significant change since last  tracing Confirmed by ALLEN  MD, ANTHONY ([id number]) on 06/09/2014 6:49:02  PM      MDM   Final diagnoses:  Near syncope  UTI (lower urinary tract infection)  Dehydration    1855 Patient presents with an episode of syncope and questionable short apneic period per bystander. She also has UTI Dx yesterday, 1d S/P completing abx course for previous UTI Dx. Labs, CXR, and EKG reassuring for this not being cardiopulmonary in nature. BUN/Scr indicate dehydration.. WBC is elevated will check lactate. Will begin gently hydrating pt due to cardiac hx. Lactate pending to R/O SIRS. Head CT pending to R/O neurologic process. Plan is to seek admission for acute renal failure and rehydration. Family and patient are aware of plan for admission.  1950 Lactic acid WNL is reassuring for no SIRS and head CT returns reassuring for no acute neurological process.  2000 [facility] hospitalist has agreed to admit. Dr. Welton Flakes declines temp admission orders, will be down soon to see patient.  Filed Vitals:   06/09/14 1915  BP: 122/49  Pulse: 82  Temp:   Resp: [address], PA-C 06/09/14 2012  Toy Baker, MD 06/12/14 1501

## 2014-06-09 NOTE — ED Notes (Signed)
Patient transported to X-ray 

## 2014-06-09 NOTE — ED Notes (Signed)
Pt here via EMS , pt had an syncopal episode while sitting in car  B/p  Via ems was 80 systolic up to 100 with 500 of ns , pt is being treated for an uti

## 2014-06-09 NOTE — H&P (Addendum)
[facility] History and Physical  Leslie Delgado WUJ:[id number] DOB: 1921/06/25 DOA: 06/09/2014  Referring physician: Marlon Pel, PA PCP: Juline Patch, MD   Chief Complaint: Syncopal Episode  HPI: Leslie Delgado is a 81 y.o. female presents with weakness and a syncopal episode. Patients daughter states they were getting out of the car and was unconscious. Patient does not recall the events. She was here yesterday and found a UTI. Patient was noted to be hypotensive. She apparently fell yesterday and today. Patient states that she has no headaches. She has no chest pain. She has not injured any extremities. No seizures noted. She did turn blue for a short time according to herr daughter. The daughter states that she gave her some rescue breaths. She does have chronic back pain. She is normally independent and gets around with a some help with a caretaker.    Review of Systems:  Constitutional:  No weight loss, night sweats, Fevers, ++fatigue.  HEENT:  No headaches, Difficulty swallowing,ear ache, nasal congestion, post nasal drip,  Cardio-vascular:  No chest pain, Orthopnea, PND, swelling in lower extremities  GI:  No heartburn, indigestion, abdominal pain, nausea, vomiting, diarrhea  Resp:  No shortness of breath with exertion or at rest. no productive cough, No coughing up of blood.No change in color of mucus.No wheezing Skin:  no rash or lesions GU:  no dysuria, change in color of urine, no urgency or frequency Musculoskeletal:  No joint pain or swelling. No decreased range of motion. ++back pain.  Psych:  No change in mood or affect. No depression or anxiety. ++memory loss.   Past Medical History  Diagnosis Date  . Cataract   . Heart murmur   . Hypertension   . Myocardial infarction   . Osteoporosis   . Diabetes mellitus without complication     TYPE 2  . Arthritis     IN FINGERS & KNEE   Past Surgical History  Procedure Laterality Date  . Appendectomy      . Eye surgery    . Abdominal hysterectomy     Social History:  reports that she has never smoked. She has never used smokeless tobacco. She reports that she does not drink alcohol or use illicit drugs.  Allergies  Allergen Reactions  . Lotensin [Benazepril Hcl] Other (See Comments)    Other   . Penicillins Itching and Rash  . Procaine Other (See Comments)    unknown  . Sulfa Antibiotics Rash    Family History  Problem Relation Age of Onset  . Heart disease Father   . Cancer Brother   . Heart disease Brother      Prior to Admission medications   Medication Sig Start Date End Date Taking? Authorizing Provider  Alpha-Lipoic Acid 600 MG CAPS Take 600 mg by mouth daily.    Yes Historical Provider, MD  aspirin 81 MG tablet Take 81 mg by mouth daily.   Yes Historical Provider, MD  carvedilol (COREG) 3.125 MG tablet Take 1 tablet (3.125 mg total) by mouth 2 (two) times daily with a meal. 12/03/13  Yes Shanker Levora Dredge, MD  Cholecalciferol 2000 UNITS TABS Take 2,000 Units by mouth daily.   Yes Historical Provider, MD  clopidogrel (PLAVIX) 75 MG tablet Take 75 mg by mouth daily.   Yes Historical Provider, MD  DULoxetine (CYMBALTA) 60 MG capsule Take 60 mg by mouth daily.   Yes Historical Provider, MD  fish oil-omega-3 fatty acids 1000 MG capsule Take 1 g by mouth  daily.    Yes Historical Provider, MD  furosemide (LASIX) 20 MG tablet Take 20 mg by mouth daily.   Yes Historical Provider, MD  insulin lispro (HUMALOG KWIKPEN) 100 UNIT/ML KiwkPen Take before meals with following scale:      Less than 130--no insulin      130 - 180 -- 4 units      180 - 250 -- 8 units      250 - 350 --12 units      350 - 400 --16 units      Over 400 -- call MD 05/28/14  Yes Reuben Likesavid C Keller, MD  losartan (COZAAR) 25 MG tablet Take 25 mg by mouth daily.   Yes Historical Provider, MD  Multiple Vitamins-Minerals (MULTIVITAMIN WITH MINERALS) tablet Take 1 tablet by mouth daily.   Yes Historical Provider, MD   pravastatin (PRAVACHOL) 20 MG tablet Take 20 mg by mouth daily.   Yes Historical Provider, MD  sitaGLIPtin (JANUVIA) 100 MG tablet Take 100 mg by mouth daily.   Yes Historical Provider, MD  cephALEXin (KEFLEX) 500 MG capsule Take 1 capsule (500 mg total) by mouth 4 (four) times daily. 06/09/14   Earle GellBenjamin W Cartner, PA-C  Insulin Glargine 300 UNIT/ML SOPN Inject 25 Units into the skin daily.    Historical Provider, MD  nitroGLYCERIN (NITROSTAT) 0.4 MG SL tablet Place 0.4 mg under the tongue every 5 (five) minutes as needed for chest pain.    Historical Provider, MD   Physical Exam: Filed Vitals:   06/09/14 1730 06/09/14 1745 06/09/14 1800 06/09/14 1915  BP: 105/33 116/34 112/42 122/49  Pulse: 74 73 78 82  Temp:      TempSrc:      Resp: 25 20 23 23   Height:      Weight:      SpO2: 96% 95% 96% 98%    Wt Readings from Last 3 Encounters:  06/09/14 77.111 kg (170 lb)  12/04/13 79.47 kg (175 lb 3.2 oz)  11/19/13 79.379 kg (175 lb)    General:  Appears calm and comfortable Eyes: PERRL, normal lids, irises & conjunctiva ENT: grossly normal hearing, lips & tongue Neck: no LAD, masses or thyromegaly Cardiovascular: RRR, no m/r/g. No LE edema. Respiratory: CTA bilaterally, no w/r/r. Normal respiratory effort. Abdomen: soft, ntnd Skin: no rash or induration seen on limited exam Musculoskeletal: grossly normal tone BUE/BLE Psychiatric: grossly normal mood and affect, speech fluent and appropriate Neurologic: grossly non-focal.          Labs on Admission:  Basic Metabolic Panel:  Recent Labs Lab 06/09/14 1740  NA 139  K 3.7  CL 106  CO2 24  GLUCOSE 75  BUN 50*  CREATININE 1.74*  CALCIUM 9.7   Liver Function Tests:  Recent Labs Lab 06/09/14 1740  AST 34  ALT 46*  ALKPHOS 60  BILITOT 0.4  PROT 5.8*  ALBUMIN 3.0*   No results for input(s): LIPASE, AMYLASE in the last 168 hours. No results for input(s): AMMONIA in the last 168 hours. CBC:  Recent Labs Lab  06/09/14 1740  WBC 16.3*  NEUTROABS 13.4*  HGB 11.9*  HCT 35.4*  MCV 85.5  PLT 212   Cardiac Enzymes: No results for input(s): CKTOTAL, CKMB, CKMBINDEX, TROPONINI in the last 168 hours.  BNP (last 3 results) No results for input(s): PROBNP in the last 8760 hours. CBG: No results for input(s): GLUCAP in the last 168 hours.  Radiological Exams on Admission: Dg Chest 2 View  06/09/2014  CLINICAL DATA:  81 year old female [place] near syncope  EXAM: CHEST  2 VIEW  COMPARISON:  Prior chest x-ray 11/30/2013  FINDINGS: Stable borderline cardiomegaly. Atherosclerotic calcifications present in the transverse aorta. Chronic bronchitic changes and interstitial prominence are similar compared to prior. No focal airspace consolidation, pulmonary edema, pleural effusion or pneumothorax. Stable remote left proximal humeral fracture and thoracolumbar vertebral body fractures. No acute osseous abnormality.  IMPRESSION: Stable chest x-ray without evidence for acute cardiopulmonary disease.   Electronically Signed   By: Malachy Moan M.D.   On: 06/09/2014 18:43   Ct Head Wo Contrast  06/09/2014   CLINICAL DATA:  Syncopal episode while sitting in the car.  EXAM: CT HEAD WITHOUT CONTRAST  TECHNIQUE: Contiguous axial images were obtained from the base of the skull through the vertex without intravenous contrast.  COMPARISON:  06/08/2014, 06/03/2009  FINDINGS: There is no evidence of mass effect, midline shift, or extra-axial fluid collections. There is no evidence of a space-occupying lesion or intracranial hemorrhage. There is no evidence of a cortical-based area of acute infarction. Left posterior frontal encephalomalacia likely related to prior insult. There is generalized cerebral atrophy. There is periventricular white matter low attenuation likely secondary to microangiopathy.  The ventricles and sulci are appropriate for the patient's age. The basal cisterns are patent.  Visualized portions of the  orbits are unremarkable. The visualized portions of the paranasal sinuses and mastoid air cells are unremarkable. Cerebrovascular atherosclerotic calcifications are noted.  The osseous structures are unremarkable. Small left parietal scalp hematoma.  IMPRESSION: 1. No acute intracranial pathology. 2. Chronic microvascular disease and cerebral atrophy.   Electronically Signed   By: Elige Ko   On: 06/09/2014 19:24   Ct Head Wo Contrast  06/08/2014   CLINICAL DATA:  Frequent falls, fall today at home. RIGHT forehead hematoma.  EXAM: CT HEAD WITHOUT CONTRAST  TECHNIQUE: Contiguous axial images were obtained from the base of the skull through the vertex without intravenous contrast.  COMPARISON:  CT of the head June 03, 2009  FINDINGS: The ventricles and sulci are normal for age. No intraparenchymal hemorrhage, mass effect nor midline shift. Patchy supratentorial white matter hypodensities are within normal range for patient's age and though non-specific suggest sequelae of chronic small vessel ischemic disease. No acute large vascular territory infarcts. LEFT posterior frontal encephalomalacia, present on prior examination.  No abnormal extra-axial fluid collections. Basal cisterns are patent. Mild calcific atherosclerosis of the carotid siphons and included vertebral arteries.  Moderate LEFT parietal scalp hematoma. No skull fracture. The included ocular globes and orbital contents are non-suspicious. Bilateral scleral banding. The mastoid aircells and included paranasal sinuses are well-aerated.  IMPRESSION: Moderate LEFT parietal scalp hematoma. No skull fracture. No acute intracranial process.  Involutional changes. LEFT frontal encephalomalacia may represent remote middle cerebral artery territory infarct. Mild to moderate white matter changes can be seen with chronic small vessel ischemic disease.   Electronically Signed   By: Awilda Metro   On: 06/08/2014 21:53     Assessment/Plan Active  Problems:   Dehydration   Syncope   UTI (lower urinary tract infection)   Hypotension   1. Syncope -appears to be a transient episode requiring rescue breathing by her daughter -right now she is alert oriented. -will monitor overnight on telemetry -check enzymes  2. Dehydration -as noted by elevated creatinine -will hydrate gently -monitor labs  3. HYPOtension -hold her BP medications -will hydrate slowly -responding to fluids  4. UTI -will start on IV rocephin -follow  up on cultures  5. Diabetes mellitus -will continue with insulin -coverage scale ordered -will get A1C    Code Status: Full Code (must indicate code status--if unknown or must be presumed, indicate so) DVT Prophylaxis:Heparin Family Communication: daughter (indicate person spoken with, if applicable, with phone number if by telephone) Disposition Plan: SNF? (indicate anticipated LOS)  Time spent:  [facility] Pager [phone]

## 2014-06-09 NOTE — ED Notes (Signed)
cbg 91 

## 2014-06-09 NOTE — Discharge Instructions (Signed)
Fall Prevention and Home Safety Falls cause injuries and can affect all age groups. It is possible to use preventive measures to significantly decrease the likelihood of falls. There are many simple measures which can make your home safer and prevent falls. OUTDOORS  Repair cracks and edges of walkways and driveways.  Remove high doorway thresholds.  Trim shrubbery on the main path into your home.  Have good outside lighting.  Clear walkways of tools, rocks, debris, and clutter.  Check that handrails are not broken and are securely fastened. Both sides of steps should have handrails.  Have leaves, snow, and ice cleared regularly.  Use sand or salt on walkways during winter months.  In the garage, clean up grease or oil spills. BATHROOM  Install night lights.  Install grab bars by the toilet and in the tub and shower.  Use non-skid mats or decals in the tub or shower.  Place a plastic non-slip stool in the shower to sit on, if needed.  Keep floors dry and clean up all water on the floor immediately.  Remove soap buildup in the tub or shower on a regular basis.  Secure bath mats with non-slip, double-sided rug tape.  Remove throw rugs and tripping hazards from the floors. BEDROOMS  Install night lights.  Make sure a bedside light is easy to reach.  Do not use oversized bedding.  Keep a telephone by your bedside.  Have a firm chair with side arms to use for getting dressed.  Remove throw rugs and tripping hazards from the floor. KITCHEN  Keep handles on pots and pans turned toward the center of the stove. Use back burners when possible.  Clean up spills quickly and allow time for drying.  Avoid walking on wet floors.  Avoid hot utensils and knives.  Position shelves so they are not too high or low.  Place commonly used objects within easy reach.  If necessary, use a sturdy step stool with a grab bar when reaching.  Keep electrical cables out of the  way.  Do not use floor polish or wax that makes floors slippery. If you must use wax, use non-skid floor wax.  Remove throw rugs and tripping hazards from the floor. STAIRWAYS  Never leave objects on stairs.  Place handrails on both sides of stairways and use them. Fix any loose handrails. Make sure handrails on both sides of the stairways are as long as the stairs.  Check carpeting to make sure it is firmly attached along stairs. Make repairs to worn or loose carpet promptly.  Avoid placing throw rugs at the top or bottom of stairways, or properly secure the rug with carpet tape to prevent slippage. Get rid of throw rugs, if possible.  Have an electrician put in a light switch at the top and bottom of the stairs. OTHER FALL PREVENTION TIPS  Wear low-heel or rubber-soled shoes that are supportive and fit well. Wear closed toe shoes.  When using a stepladder, make sure it is fully opened and both spreaders are firmly locked. Do not climb a closed stepladder.  Add color or contrast paint or tape to grab bars and handrails in your home. Place contrasting color strips on first and last steps.  Learn and use mobility aids as needed. Install an electrical emergency response system.  Turn on lights to avoid dark areas. Replace light bulbs that burn out immediately. Get light switches that glow.  Arrange furniture to create clear pathways. Keep furniture in the same place.  Firmly attach carpet with non-skid or double-sided tape.  Eliminate uneven floor surfaces.  Select a carpet pattern that does not visually hide the edge of steps.  Be aware of all pets. OTHER HOME SAFETY TIPS  Set the water temperature for 120 F (48.8 C).  Keep emergency numbers on or near the telephone.  Keep smoke detectors on every level of the home and near sleeping areas. Document Released: 04/26/2002 Document Revised: 11/05/2011 Document Reviewed: 07/26/2011 [facility]ExitCare Patient Information 2015  [employer] [place]. This information is not intended to replace advice given to you by your health care provider. Make sure you discuss any questions you have with your health care provider.  You were evaluated in the ED today for your fall. There is not appear to be any emergent cause for your symptoms today. It is important for you to follow up with primary care within 48 hours for further evaluation and management of your symptoms as well as recheck of your wound. Please take all of your antibiotics as prescribed. Return to ED for worsening symptoms.

## 2014-06-10 DIAGNOSIS — I129 Hypertensive chronic kidney disease with stage 1 through stage 4 chronic kidney disease, or unspecified chronic kidney disease: Secondary | ICD-10-CM | POA: Diagnosis present

## 2014-06-10 DIAGNOSIS — I5022 Chronic systolic (congestive) heart failure: Secondary | ICD-10-CM | POA: Diagnosis present

## 2014-06-10 DIAGNOSIS — E860 Dehydration: Secondary | ICD-10-CM | POA: Diagnosis present

## 2014-06-10 DIAGNOSIS — M549 Dorsalgia, unspecified: Secondary | ICD-10-CM | POA: Diagnosis present

## 2014-06-10 DIAGNOSIS — Z794 Long term (current) use of insulin: Secondary | ICD-10-CM | POA: Diagnosis not present

## 2014-06-10 DIAGNOSIS — I252 Old myocardial infarction: Secondary | ICD-10-CM | POA: Diagnosis not present

## 2014-06-10 DIAGNOSIS — R296 Repeated falls: Secondary | ICD-10-CM | POA: Diagnosis present

## 2014-06-10 DIAGNOSIS — Z7982 Long term (current) use of aspirin: Secondary | ICD-10-CM | POA: Diagnosis not present

## 2014-06-10 DIAGNOSIS — Z9181 History of falling: Secondary | ICD-10-CM | POA: Diagnosis not present

## 2014-06-10 DIAGNOSIS — G8929 Other chronic pain: Secondary | ICD-10-CM | POA: Diagnosis present

## 2014-06-10 DIAGNOSIS — N183 Chronic kidney disease, stage 3 (moderate): Secondary | ICD-10-CM | POA: Diagnosis present

## 2014-06-10 DIAGNOSIS — I429 Cardiomyopathy, unspecified: Secondary | ICD-10-CM | POA: Diagnosis present

## 2014-06-10 DIAGNOSIS — Z8249 Family history of ischemic heart disease and other diseases of the circulatory system: Secondary | ICD-10-CM | POA: Diagnosis not present

## 2014-06-10 DIAGNOSIS — I951 Orthostatic hypotension: Secondary | ICD-10-CM | POA: Diagnosis present

## 2014-06-10 DIAGNOSIS — Z7901 Long term (current) use of anticoagulants: Secondary | ICD-10-CM | POA: Diagnosis not present

## 2014-06-10 DIAGNOSIS — W1830XA Fall on same level, unspecified, initial encounter: Secondary | ICD-10-CM | POA: Diagnosis present

## 2014-06-10 DIAGNOSIS — E1122 Type 2 diabetes mellitus with diabetic chronic kidney disease: Secondary | ICD-10-CM | POA: Diagnosis present

## 2014-06-10 DIAGNOSIS — B964 Proteus (mirabilis) (morganii) as the cause of diseases classified elsewhere: Secondary | ICD-10-CM | POA: Diagnosis present

## 2014-06-10 DIAGNOSIS — N390 Urinary tract infection, site not specified: Secondary | ICD-10-CM | POA: Diagnosis present

## 2014-06-10 DIAGNOSIS — R748 Abnormal levels of other serum enzymes: Secondary | ICD-10-CM | POA: Diagnosis present

## 2014-06-10 DIAGNOSIS — N179 Acute kidney failure, unspecified: Secondary | ICD-10-CM | POA: Diagnosis present

## 2014-06-10 LAB — CBC
HEMATOCRIT: 33.8 % — AB (ref 36.0–46.0)
Hemoglobin: 11.2 g/dL — ABNORMAL LOW (ref 12.0–15.0)
MCH: 28.5 pg (ref 26.0–34.0)
MCHC: 33.1 g/dL (ref 30.0–36.0)
MCV: 86 fL (ref 78.0–100.0)
Platelets: 228 10*3/uL (ref 150–400)
RBC: 3.93 MIL/uL (ref 3.87–5.11)
RDW: 14.5 % (ref 11.5–15.5)
WBC: 12 10*3/uL — AB (ref 4.0–10.5)

## 2014-06-10 LAB — TROPONIN I
TROPONIN I: 0.03 ng/mL (ref <0.031)
TROPONIN I: 0.05 ng/mL — AB (ref <0.031)

## 2014-06-10 LAB — COMPREHENSIVE METABOLIC PANEL
ALT: 56 U/L — AB (ref 0–35)
ANION GAP: 9 (ref 5–15)
AST: 38 U/L — ABNORMAL HIGH (ref 0–37)
Albumin: 2.7 g/dL — ABNORMAL LOW (ref 3.5–5.2)
Alkaline Phosphatase: 53 U/L (ref 39–117)
BUN: 43 mg/dL — AB (ref 6–23)
CO2: 24 mmol/L (ref 19–32)
Calcium: 9 mg/dL (ref 8.4–10.5)
Chloride: 107 mEq/L (ref 96–112)
Creatinine, Ser: 1.44 mg/dL — ABNORMAL HIGH (ref 0.50–1.10)
GFR calc non Af Amer: 31 mL/min — ABNORMAL LOW (ref >90)
GFR, EST AFRICAN AMERICAN: 36 mL/min — AB (ref >90)
Glucose, Bld: 178 mg/dL — ABNORMAL HIGH (ref 70–99)
Potassium: 3.5 mmol/L (ref 3.5–5.1)
Sodium: 140 mmol/L (ref 135–145)
TOTAL PROTEIN: 5.3 g/dL — AB (ref 6.0–8.3)
Total Bilirubin: 0.7 mg/dL (ref 0.3–1.2)

## 2014-06-10 LAB — HEMOGLOBIN A1C
Hgb A1c MFr Bld: 8.4 % — ABNORMAL HIGH (ref <5.7)
MEAN PLASMA GLUCOSE: 194 mg/dL — AB (ref <117)

## 2014-06-10 LAB — GLUCOSE, CAPILLARY
GLUCOSE-CAPILLARY: 91 mg/dL (ref 70–99)
GLUCOSE-CAPILLARY: 320 mg/dL — AB (ref 70–99)
Glucose-Capillary: 160 mg/dL — ABNORMAL HIGH (ref 70–99)
Glucose-Capillary: 268 mg/dL — ABNORMAL HIGH (ref 70–99)
Glucose-Capillary: 187 mg/dL — ABNORMAL HIGH (ref 70–99)

## 2014-06-10 MED ORDER — CEFTRIAXONE SODIUM IN DEXTROSE 20 MG/ML IV SOLN
1.0000 g | INTRAVENOUS | Status: DC
  Administered 2014-06-10 – 2014-06-12 (×3): 1 g via INTRAVENOUS
  Filled 2014-06-10 (×3): qty 50

## 2014-06-10 NOTE — Progress Notes (Signed)
PROGRESS NOTE  Leslie Delgado ZOX:[id number] DOB: 05-25-1921 DOA: 06/09/2014 PCP: Juline Patch, MD  Assessment/Plan: Syncope- would guess orthostatic as patient was hypotensive and had AKI -will monitor overnight on telemetry -check enzymes  AKI -IVF and recheck in AM  Dehydration -as noted by elevated creatinine -will hydrate gently -monitor labs  HYPOtension -hold her BP medications -will hydrate slowly -responding to fluids  UTI -will start on IV rocephin -follow up on cultures  Diabetes mellitus -will continue with insulin -coverage scale ordered -will get A1C  Code Status: full Family Communication: patient Disposition Plan:    Consultants:    Procedures:      HPI/Subjective: Feeling better Ready to eat  Objective: Filed Vitals:   06/10/14 0530  BP: 138/62  Pulse: 86  Temp: 98.5 F (36.9 C)  Resp: 21    Intake/Output Summary (Last 24 hours) at 06/10/14 0901 Last data filed at 06/10/14 0600  Gross per 24 hour  Intake   1040 ml  Output    100 ml  Net    940 ml   Filed Weights   06/09/14 1649 06/09/14 2109 06/10/14 0530  Weight: 77.111 kg (170 lb) 73.483 kg (162 lb) 73.378 kg (161 lb 12.3 oz)    Exam:   General:  A+Ox3, NAD  Cardiovascular: rrr  Respiratory: clear  Abdomen: +BS, soft  Musculoskeletal: no focal weakness   Data Reviewed: Basic Metabolic Panel:  Recent Labs Lab 06/09/14 1740 06/10/14 0330  NA 139 140  K 3.7 3.5  CL 106 107  CO2 24 24  GLUCOSE 75 178*  BUN 50* 43*  CREATININE 1.74* 1.44*  CALCIUM 9.7 9.0   Liver Function Tests:  Recent Labs Lab 06/09/14 1740 06/10/14 0330  AST 34 38*  ALT 46* 56*  ALKPHOS 60 53  BILITOT 0.4 0.7  PROT 5.8* 5.3*  ALBUMIN 3.0* 2.7*   No results for input(s): LIPASE, AMYLASE in the last 168 hours. No results for input(s): AMMONIA in the last 168 hours. CBC:  Recent Labs Lab 06/09/14 1740 06/10/14 0330  WBC 16.3* 12.0*  NEUTROABS 13.4*  --   HGB  11.9* 11.2*  HCT 35.4* 33.8*  MCV 85.5 86.0  PLT 212 228   Cardiac Enzymes:  Recent Labs Lab 06/09/14 2208 06/10/14 0330  TROPONINI 0.03 0.03   BNP (last 3 results) No results for input(s): PROBNP in the last 8760 hours. CBG:  Recent Labs Lab 06/09/14 2122  GLUCAP 73    No results found for this or any previous visit (from the past 240 hour(s)).   Studies: Dg Chest 2 View  06/09/2014   CLINICAL DATA:  81 year old female [place] near syncope  EXAM: CHEST  2 VIEW  COMPARISON:  Prior chest x-ray 11/30/2013  FINDINGS: Stable borderline cardiomegaly. Atherosclerotic calcifications present in the transverse aorta. Chronic bronchitic changes and interstitial prominence are similar compared to prior. No focal airspace consolidation, pulmonary edema, pleural effusion or pneumothorax. Stable remote left proximal humeral fracture and thoracolumbar vertebral body fractures. No acute osseous abnormality.  IMPRESSION: Stable chest x-ray without evidence for acute cardiopulmonary disease.   Electronically Signed   By: Malachy Moan M.D.   On: 06/09/2014 18:43   Ct Head Wo Contrast  06/09/2014   CLINICAL DATA:  Syncopal episode while sitting in the car.  EXAM: CT HEAD WITHOUT CONTRAST  TECHNIQUE: Contiguous axial images were obtained from the base of the skull through the vertex without intravenous contrast.  COMPARISON:  06/08/2014, 06/03/2009  FINDINGS: There is no evidence  of mass effect, midline shift, or extra-axial fluid collections. There is no evidence of a space-occupying lesion or intracranial hemorrhage. There is no evidence of a cortical-based area of acute infarction. Left posterior frontal encephalomalacia likely related to prior insult. There is generalized cerebral atrophy. There is periventricular white matter low attenuation likely secondary to microangiopathy.  The ventricles and sulci are appropriate for the patient's age. The basal cisterns are patent.  Visualized portions of  the orbits are unremarkable. The visualized portions of the paranasal sinuses and mastoid air cells are unremarkable. Cerebrovascular atherosclerotic calcifications are noted.  The osseous structures are unremarkable. Small left parietal scalp hematoma.  IMPRESSION: 1. No acute intracranial pathology. 2. Chronic microvascular disease and cerebral atrophy.   Electronically Signed   By: Elige KoHetal  Patel   On: 06/09/2014 19:24   Ct Head Wo Contrast  06/08/2014   CLINICAL DATA:  Frequent falls, fall today at home. RIGHT forehead hematoma.  EXAM: CT HEAD WITHOUT CONTRAST  TECHNIQUE: Contiguous axial images were obtained from the base of the skull through the vertex without intravenous contrast.  COMPARISON:  CT of the head June 03, 2009  FINDINGS: The ventricles and sulci are normal for age. No intraparenchymal hemorrhage, mass effect nor midline shift. Patchy supratentorial white matter hypodensities are within normal range for patient's age and though non-specific suggest sequelae of chronic small vessel ischemic disease. No acute large vascular territory infarcts. LEFT posterior frontal encephalomalacia, present on prior examination.  No abnormal extra-axial fluid collections. Basal cisterns are patent. Mild calcific atherosclerosis of the carotid siphons and included vertebral arteries.  Moderate LEFT parietal scalp hematoma. No skull fracture. The included ocular globes and orbital contents are non-suspicious. Bilateral scleral banding. The mastoid aircells and included paranasal sinuses are well-aerated.  IMPRESSION: Moderate LEFT parietal scalp hematoma. No skull fracture. No acute intracranial process.  Involutional changes. LEFT frontal encephalomalacia may represent remote middle cerebral artery territory infarct. Mild to moderate white matter changes can be seen with chronic small vessel ischemic disease.   Electronically Signed   By: Awilda Metroourtnay  Bloomer   On: 06/08/2014 21:53    Scheduled Meds: . aspirin   81 mg Oral Daily  . clopidogrel  75 mg Oral Daily  . DULoxetine  60 mg Oral Daily  . folic acid  1 mg Oral Daily  . heparin  5,000 Units Subcutaneous 3 times per day  . insulin aspart  0-9 Units Subcutaneous TID WC  . linagliptin  5 mg Oral Daily  . multivitamin with minerals  1 tablet Oral Daily  . pravastatin  20 mg Oral Daily  . sodium chloride  3 mL Intravenous Q12H  . thiamine  100 mg Oral Daily   Continuous Infusions: . sodium chloride     Antibiotics Given (last 72 hours)    None      Active Problems:   Dehydration   Syncope   UTI (lower urinary tract infection)   Hypotension    Time spent: 25 min    Lason Eveland  [facility] Pager [phone]. If 7PM-7AM, please contact night-coverage at [email or web address], password [email or web address] 06/10/2014, 9:01 AM  LOS: 1 day

## 2014-06-10 NOTE — Clinical Social Work Placement (Addendum)
Clinical Social Work Department CLINICAL SOCIAL WORK PLACEMENT NOTE 06/10/2014  Patient:  Leslie Delgado,Leslie Delgado  Account Number:  [id number] Admit date:  06/09/2014  Clinical Social Worker:  Derenda FennelBASHIRA NIXON, CLINICAL SOCIAL WORKER  Date/time:  06/10/2014 03:59 PM  Clinical Social Work is seeking post-discharge placement for this patient at the following level of care:   SKILLED NURSING   (*CSW will update this form in Epic as items are completed)   06/10/2014  [facility]family provided with [facility] of Clinical Social Work's list of facilities offering this level of care within the geographic area requested by the patient (or if unable, by the patient's family).  06/10/2014  Patient/family informed of their freedom to choose among providers that offer the needed level of care, that participate in Medicare, Medicaid or managed care program needed by the patient, have an available bed and are willing to accept the patient.  06/10/2014  Patient/family informed of [facility]' ownership interest in [facility], as well as of the fact that they are under no obligation to receive care at this facility.  PASARR submitted to EDS on EXISTING  PASARR number received on EXISTING   FL2 transmitted to all facilities in geographic area requested by pt/family on  06/10/2014 FL2 transmitted to all facilities within larger geographic area on   Patient informed that his/her managed care company has contracts with or will negotiate with  certain facilities, including the following:  YES   Patient/family informed of bed offers received:  06/13/2014 [facility] and patient chooses bed at    Patient to be transferred to [facility] on  06/13/2014 Patient to be transferred to facility by [facility] Patient and family notified of transfer on 06/13/2014 Name of family member notified:  Otila BackEmily Robertson  The following physician request were entered in  Epic:   Additional Comments:   Derenda FennelBashira Nixon, MSW, LCSWA [phone] 06/10/2014 4:02 PM

## 2014-06-10 NOTE — Progress Notes (Signed)
UR completed 

## 2014-06-10 NOTE — Clinical Social Work Psychosocial (Signed)
Clinical Social Work Department BRIEF PSYCHOSOCIAL ASSESSMENT 06/10/2014  Patient:  Leslie Delgado, Leslie Delgado     Account Number:  [id number]     Admit date:  06/09/2014  Clinical Social Worker:  Glendon Axe, CLINICAL SOCIAL WORKER  Date/Time:  06/10/2014 01:45 PM  Referred by:  Care Management  Date Referred:  06/10/2014 Referred for  SNF Placement   Other Referral:   Interview type:  Patient Other interview type:   CSW met with patient at bedside.    PSYCHOSOCIAL DATA Living Status:  ALONE Admitted from facility:   Level of care:   Primary support name:  Ernie Avena [phone] Primary support relationship to patient:  CHILD, ADULT Degree of support available:   Strong    CURRENT CONCERNS Current Concerns  Post-Acute Placement   Other Concerns:    SOCIAL WORK ASSESSMENT / PLAN Clinical  Social Worker met with patient at bedside in reference to post-acute placement for SNF. CSW explained CSW role and SNF process. CSW also reviewed and provided SNF list. Pt's reported she lives at home alone and has 2 children that are very involved in her care. Pt stated she has a dtr, Raquel Sarna and that checks on her daily. Pt further reported she is familiar with [facility] and would like SNF placement within [facility] Pt expressed no further concerns. Pt's gave CSW permission to contact pt's dtr as needed. CSW will continue to follow pt and pt's family for continued support and to facilitate pt's discharge needs once medically stable.   Assessment/plan status:  Psychosocial Support/Ongoing Assessment of Needs Other assessment/ plan:   Complete FL-2 and fax to SNF's.   Information/referral to community resources:   SNF information and list provided.    PATIENT'S/FAMILY'S RESPONSE TO PLAN OF CARE: Pt sitting up in bed alert and oriented X4. Pt is smiling and enjoying winter scenery. Pt agreeable to SNF and apperciated social work intervention.     Glendon Axe, MSW,  LCSWA [phone] 06/10/2014 3:58 PM

## 2014-06-10 NOTE — Evaluation (Signed)
Physical Therapy Evaluation Patient Details Name: Leslie ColtRachel B Gertz MRN: [id number] DOB: 1922/04/15 Today's Date: 06/10/2014   History of Present Illness  Pt admit for syncope.  Clinical Impression  Pt admitted with above diagnosis. Pt currently with functional limitations due to the deficits listed below (see PT Problem List). Pt will need NHP.  Mod assist level at present with poor endurance.   Pt will benefit from skilled PT to increase their independence and safety with mobility to allow discharge to the venue listed below.      Follow Up Recommendations SNF;Supervision/Assistance - 24 hour    Equipment Recommendations  Other (comment) (TBA)    Recommendations for Other Services       Precautions / Restrictions Precautions Precautions: Fall Restrictions Weight Bearing Restrictions: No      Mobility  Bed Mobility Overal bed mobility: Needs Assistance;+2 for physical assistance Bed Mobility: Supine to Sit     Supine to sit: Mod assist     General bed mobility comments: Needed assist for LEs and elevation of trunk.  changed pts bed pad as she had urinated on it.  Cleaned pt.   Transfers                    Ambulation/Gait                Stairs            Wheelchair Mobility    Modified Rankin (Stroke Patients Only)       Balance Overall balance assessment: Needs assistance;History of Falls Sitting-balance support: No upper extremity supported;Feet supported Sitting balance-Leahy Scale: Fair Sitting balance - Comments: Could sit EOB but only sat 4 minutes and said she was tired and began to lie down.                                       Pertinent Vitals/Pain Pain Assessment: No/denies pain  VSS    Home Living Family/patient expects to be discharged to:: Skilled nursing facility Living Arrangements: Alone Available Help at Discharge: Family;Available PRN/intermittently (Daughter lives 45 minutes away.) Type of Home:  House Home Access: Stairs to enter Entrance Stairs-Rails: Doctor, general practiceight;Left Entrance Stairs-Number of Steps: 6 Home Layout: Two level Home Equipment: Environmental consultantWalker - 2 wheels;Cane - quad;Shower seat - built in;Wheelchair - manual Additional Comments: FAmily wants NH    Prior Function Level of Independence: Independent with assistive device(s)         Comments: Pt reports performing ADLs independently with some difficulty. Does not do much cooking. Still drives.     Hand Dominance   Dominant Hand: Right    Extremity/Trunk Assessment   Upper Extremity Assessment: Defer to OT evaluation           Lower Extremity Assessment: Generalized weakness      Cervical / Trunk Assessment: Kyphotic  Communication   Communication: No difficulties  Cognition Arousal/Alertness: Awake/alert Behavior During Therapy: WFL for tasks assessed/performed Overall Cognitive Status: History of cognitive impairments - at baseline                      General Comments General comments (skin integrity, edema, etc.): Multiple bruises on LEs noted.     Exercises        Assessment/Plan    PT Assessment Patient needs continued PT services  PT Diagnosis Generalized weakness   PT Problem List Decreased activity tolerance;Decreased  balance;Decreased mobility;Decreased knowledge of use of DME;Decreased safety awareness;Decreased knowledge of precautions;Decreased strength  PT Treatment Interventions DME instruction;Gait training;Therapeutic activities;Functional mobility training;Therapeutic exercise;Balance training;Patient/family education   PT Goals (Current goals can be found in the Care Plan section) Acute Rehab PT Goals Patient Stated Goal: to get better PT Goal Formulation: With patient Time For Goal Achievement: 06/24/14 Potential to Achieve Goals: Fair    Frequency Min 3X/week   Barriers to discharge Decreased caregiver support      Co-evaluation               End of Session    Activity Tolerance: Patient limited by fatigue Patient left: in bed;with call bell/phone within reach;with bed alarm set Nurse Communication: Mobility status    Functional Assessment Tool Used: clinical judgment Functional Limitation: Changing and maintaining body position Changing and Maintaining Body Position Current Status (N8295): At least 40 percent but less than 60 percent impaired, limited or restricted Changing and Maintaining Body Position Goal Status (A2130): At least 20 percent but less than 40 percent impaired, limited or restricted    Time: 0956-1004 PT Time Calculation (min) (ACUTE ONLY): 8 min   Charges:   PT Evaluation $Initial PT Evaluation Tier I: 1 Procedure     PT G Codes:   PT G-Codes **NOT FOR INPATIENT CLASS** Functional Assessment Tool Used: clinical judgment Functional Limitation: Changing and maintaining body position Changing and Maintaining Body Position Current Status (Q6578): At least 40 percent but less than 60 percent impaired, limited or restricted Changing and Maintaining Body Position Goal Status (I6962): At least 20 percent but less than 40 percent impaired, limited or restricted    Tawni Millers F 06/10/2014, 10:34 AM [facility] [phone] (pager)

## 2014-06-11 LAB — CBC
HCT: 32.4 % — ABNORMAL LOW (ref 36.0–46.0)
HEMOGLOBIN: 10.9 g/dL — AB (ref 12.0–15.0)
MCH: 29.4 pg (ref 26.0–34.0)
MCHC: 33.6 g/dL (ref 30.0–36.0)
MCV: 87.3 fL (ref 78.0–100.0)
Platelets: 212 10*3/uL (ref 150–400)
RBC: 3.71 MIL/uL — AB (ref 3.87–5.11)
RDW: 14.6 % (ref 11.5–15.5)
WBC: 11.6 10*3/uL — ABNORMAL HIGH (ref 4.0–10.5)

## 2014-06-11 LAB — BASIC METABOLIC PANEL
ANION GAP: 11 (ref 5–15)
BUN: 27 mg/dL — ABNORMAL HIGH (ref 6–23)
CALCIUM: 8 mg/dL — AB (ref 8.4–10.5)
CHLORIDE: 106 mmol/L (ref 96–112)
CO2: 18 mmol/L — ABNORMAL LOW (ref 19–32)
Creatinine, Ser: 1.35 mg/dL — ABNORMAL HIGH (ref 0.50–1.10)
GFR calc Af Amer: 39 mL/min — ABNORMAL LOW (ref >90)
GFR calc non Af Amer: 33 mL/min — ABNORMAL LOW (ref >90)
Glucose, Bld: 287 mg/dL — ABNORMAL HIGH (ref 70–99)
POTASSIUM: 3.6 mmol/L (ref 3.5–5.1)
Sodium: 135 mmol/L (ref 135–145)

## 2014-06-11 LAB — GLUCOSE, CAPILLARY
GLUCOSE-CAPILLARY: 169 mg/dL — AB (ref 70–99)
GLUCOSE-CAPILLARY: 242 mg/dL — AB (ref 70–99)
Glucose-Capillary: 238 mg/dL — ABNORMAL HIGH (ref 70–99)
Glucose-Capillary: 229 mg/dL — ABNORMAL HIGH (ref 70–99)

## 2014-06-11 LAB — URINE CULTURE
Colony Count: >=100000
SPECIAL REQUESTS: NORMAL

## 2014-06-11 LAB — TROPONIN I: TROPONIN I: 0.06 ng/mL — AB (ref <0.031)

## 2014-06-11 NOTE — Progress Notes (Addendum)
PROGRESS NOTE  Leslie Delgado ZOX:[id number] DOB: 05-Nov-1921 DOA: 06/09/2014 PCP: Juline PatchPANG,RICHARD, MD  Leslie ColtRachel B Delgado is a 81 y.o. female presents with weakness and a syncopal episode. Patients daughter states they were getting out of the car and was unconscious. Patient does not recall the events. She was here yesterday and found a UTI. Patient was noted to be hypotensive.  Patient awaiting SNF placement   Assessment/Plan: Syncope- would guess orthostatic as patient was hypotensive and had AKI -will monitor overnight on telemetry -check enzymes  Elevated troponin -no CP -added troponin to trend  AKI -IVF and recheck in AM  Dehydration -as noted by elevated creatinine -will hydrate gently -monitor labs  HYPOtension -hold her BP medications -will hydrate slowly -responding to fluids  UTI -will start on IV rocephin -follow up on cultures- gram negative rods  Diabetes mellitus -will continue with insulin -coverage scale ordered -will get A1C: 8.4  Code Status: full Family Communication: patient/daughter 1/22 Disposition Plan:    Consultants:    Procedures:      HPI/Subjective: Feeling better Ready to eat  Objective: Filed Vitals:   06/11/14 0450  BP: 165/52  Pulse: 86  Temp: 98.5 F (36.9 C)  Resp: 23    Intake/Output Summary (Last 24 hours) at 06/11/14 0935 Last data filed at 06/11/14 0838  Gross per 24 hour  Intake 1491.25 ml  Output      0 ml  Net 1491.25 ml   Filed Weights   06/09/14 2109 06/10/14 0530 06/11/14 0450  Weight: 73.483 kg (162 lb) 73.378 kg (161 lb 12.3 oz) 75.161 kg (165 lb 11.2 oz)    Exam:   General:  A+Ox3, NAD  Cardiovascular: rrr  Respiratory: clear  Abdomen: +BS, soft  Musculoskeletal: no focal weakness   Data Reviewed: Basic Metabolic Panel:  Recent Labs Lab 06/09/14 1740 06/10/14 0330  NA 139 140  K 3.7 3.5  CL 106 107  CO2 24 24  GLUCOSE 75 178*  BUN 50* 43*  CREATININE 1.74* 1.44*   CALCIUM 9.7 9.0   Liver Function Tests:  Recent Labs Lab 06/09/14 1740 06/10/14 0330  AST 34 38*  ALT 46* 56*  ALKPHOS 60 53  BILITOT 0.4 0.7  PROT 5.8* 5.3*  ALBUMIN 3.0* 2.7*   No results for input(s): LIPASE, AMYLASE in the last 168 hours. No results for input(s): AMMONIA in the last 168 hours. CBC:  Recent Labs Lab 06/09/14 1740 06/10/14 0330  WBC 16.3* 12.0*  NEUTROABS 13.4*  --   HGB 11.9* 11.2*  HCT 35.4* 33.8*  MCV 85.5 86.0  PLT 212 228   Cardiac Enzymes:  Recent Labs Lab 06/09/14 2208 06/10/14 0330 06/10/14 0850  TROPONINI 0.03 0.03 0.05*   BNP (last 3 results) No results for input(s): PROBNP in the last 8760 hours. CBG:  Recent Labs Lab 06/10/14 0901 06/10/14 1151 06/10/14 1709 06/10/14 2218 06/11/14 0722  GLUCAP 160* 268* 320* 187* 169*    Recent Results (from the past 240 hour(s))  Urine culture     Status: None (Preliminary result)   Collection Time: 06/08/14 11:02 PM  Result Value Ref Range Status   Specimen Description URINE, CLEAN CATCH  Final   Special Requests Normal  Final   Colony Count   Final    >=100,000 COLONIES/ML Performed at [facility]    Culture   Final    GRAM NEGATIVE RODS Performed at [facility]    Report Status PENDING  Incomplete  Studies: Dg Chest 2 View  06/09/2014   CLINICAL DATA:  81 year old female [place] near syncope  EXAM: CHEST  2 VIEW  COMPARISON:  Prior chest x-ray 11/30/2013  FINDINGS: Stable borderline cardiomegaly. Atherosclerotic calcifications present in the transverse aorta. Chronic bronchitic changes and interstitial prominence are similar compared to prior. No focal airspace consolidation, pulmonary edema, pleural effusion or pneumothorax. Stable remote left proximal humeral fracture and thoracolumbar vertebral body fractures. No acute osseous abnormality.  IMPRESSION: Stable chest x-ray without evidence for acute cardiopulmonary disease.   Electronically Signed    By: Malachy Moan M.D.   On: 06/09/2014 18:43   Ct Head Wo Contrast  06/09/2014   CLINICAL DATA:  Syncopal episode while sitting in the car.  EXAM: CT HEAD WITHOUT CONTRAST  TECHNIQUE: Contiguous axial images were obtained from the base of the skull through the vertex without intravenous contrast.  COMPARISON:  06/08/2014, 06/03/2009  FINDINGS: There is no evidence of mass effect, midline shift, or extra-axial fluid collections. There is no evidence of a space-occupying lesion or intracranial hemorrhage. There is no evidence of a cortical-based area of acute infarction. Left posterior frontal encephalomalacia likely related to prior insult. There is generalized cerebral atrophy. There is periventricular white matter low attenuation likely secondary to microangiopathy.  The ventricles and sulci are appropriate for the patient's age. The basal cisterns are patent.  Visualized portions of the orbits are unremarkable. The visualized portions of the paranasal sinuses and mastoid air cells are unremarkable. Cerebrovascular atherosclerotic calcifications are noted.  The osseous structures are unremarkable. Small left parietal scalp hematoma.  IMPRESSION: 1. No acute intracranial pathology. 2. Chronic microvascular disease and cerebral atrophy.   Electronically Signed   By: Elige Ko   On: 06/09/2014 19:24    Scheduled Meds: . aspirin  81 mg Oral Daily  . cefTRIAXone (ROCEPHIN)  IV  1 g Intravenous Q24H  . clopidogrel  75 mg Oral Daily  . DULoxetine  60 mg Oral Daily  . folic acid  1 mg Oral Daily  . heparin  5,000 Units Subcutaneous 3 times per day  . insulin aspart  0-9 Units Subcutaneous TID WC  . linagliptin  5 mg Oral Daily  . multivitamin with minerals  1 tablet Oral Daily  . pravastatin  20 mg Oral Daily  . sodium chloride  3 mL Intravenous Q12H  . thiamine  100 mg Oral Daily   Continuous Infusions: . sodium chloride 75 mL/hr (06/11/14 0218)   Antibiotics Given (last 72 hours)     Date/Time Action Medication Dose Rate   06/10/14 1059 Given   cefTRIAXone (ROCEPHIN) 1 g in dextrose 5 % 50 mL IVPB - Premix 1 g 100 mL/hr      Active Problems:   Dehydration   Syncope   UTI (lower urinary tract infection)   Hypotension    Time spent: 25 min    Waylon Hershey  [facility] Pager [phone]. If 7PM-7AM, please contact night-coverage at [email or web address], password [facility] 06/11/2014, 9:35 AM  LOS: 2 days

## 2014-06-12 DIAGNOSIS — IMO0002 Reserved for concepts with insufficient information to code with codable children: Secondary | ICD-10-CM | POA: Diagnosis present

## 2014-06-12 DIAGNOSIS — N183 Chronic kidney disease, stage 3 unspecified: Secondary | ICD-10-CM | POA: Diagnosis present

## 2014-06-12 DIAGNOSIS — I5022 Chronic systolic (congestive) heart failure: Secondary | ICD-10-CM

## 2014-06-12 DIAGNOSIS — R7989 Other specified abnormal findings of blood chemistry: Secondary | ICD-10-CM

## 2014-06-12 DIAGNOSIS — E1129 Type 2 diabetes mellitus with other diabetic kidney complication: Secondary | ICD-10-CM

## 2014-06-12 DIAGNOSIS — R778 Other specified abnormalities of plasma proteins: Secondary | ICD-10-CM | POA: Diagnosis not present

## 2014-06-12 DIAGNOSIS — E1165 Type 2 diabetes mellitus with hyperglycemia: Secondary | ICD-10-CM

## 2014-06-12 LAB — GLUCOSE, CAPILLARY
Glucose-Capillary: 227 mg/dL — ABNORMAL HIGH (ref 70–99)
Glucose-Capillary: 258 mg/dL — ABNORMAL HIGH (ref 70–99)
Glucose-Capillary: 272 mg/dL — ABNORMAL HIGH (ref 70–99)
Glucose-Capillary: 223 mg/dL — ABNORMAL HIGH (ref 70–99)

## 2014-06-12 MED ORDER — CARVEDILOL 3.125 MG PO TABS
3.1250 mg | ORAL_TABLET | Freq: Two times a day (BID) | ORAL | Status: DC
  Administered 2014-06-12 – 2014-06-13 (×2): 3.125 mg via ORAL
  Filled 2014-06-12 (×2): qty 1

## 2014-06-12 MED ORDER — MULTI-VITAMIN/MINERALS PO TABS
1.0000 | ORAL_TABLET | Freq: Every day | ORAL | Status: DC
Start: 2014-06-12 — End: 2014-06-12

## 2014-06-12 MED ORDER — INSULIN GLARGINE 300 UNIT/ML ~~LOC~~ SOPN: 25.0000 [IU] | PEN_INJECTOR | Freq: Every day | SUBCUTANEOUS | Status: DC

## 2014-06-12 MED ORDER — INSULIN GLARGINE 100 UNIT/ML ~~LOC~~ SOLN
25.0000 [IU] | Freq: Every day | SUBCUTANEOUS | Status: DC
  Administered 2014-06-12 – 2014-06-13 (×2): 25 [IU] via SUBCUTANEOUS
  Filled 2014-06-12 (×3): qty 0.25

## 2014-06-12 MED ORDER — CEPHALEXIN 500 MG PO CAPS
500.0000 mg | ORAL_CAPSULE | Freq: Two times a day (BID) | ORAL | Status: DC
  Administered 2014-06-12 – 2014-06-13 (×3): 500 mg via ORAL
  Filled 2014-06-12 (×3): qty 1

## 2014-06-12 NOTE — Progress Notes (Signed)
PROGRESS NOTE  Leslie Delgado ZOX:[id number] DOB: [birth year] DOA: 06/09/2014 PCP: Juline Patch, MD  Leslie Delgado is a 81 y.o. female presents with weakness and a syncopal episode. Patients daughter states they were getting out of the car and was unconscious. Patient does not recall the events. She was here yesterday and found a UTI. Patient was noted to be hypotensive.  Patient awaiting SNF placement   Assessment/Plan: Syncope- would guess orthostatic as patient was hypotensive and had AKI None further. D/c telemetry  Elevated troponin, minimal -no CP, EKG unchanged. Doubt ACS. D/c telemetry  AKI Resolving. Has h/o CKD 3. D/c fluid EF only 25%.  Dehydration -as noted by elevated creatinine euvolemic currently  HYPOtension Resolved. Resume low dose coreg for cardiomyopathy  UTI, proteus: change to keflex  Diabetes mellitus A1C: 8.4. CBGs high. Resume lantus. Continue SSI and januvia  Chronic systolic HF. Compensated. Lasix held. D/c IVF. Resume coreg today, cozaar as BP tolerated in upcoming days  Code Status: full Family Communication: patient/daughter 1/22 Disposition Plan: SNF when bed found. Patient lives in [place] and prefers [facility]   Consultants:    Procedures:      HPI/Subjective: No complaints. No chest pain. No dyspnea. Appetite good.  Objective: Filed Vitals:   06/12/14 0529  BP: 125/53  Pulse: 82  Temp: 98.5 F (36.9 C)  Resp: 22    Intake/Output Summary (Last 24 hours) at 06/12/14 1137 Last data filed at 06/12/14 0853  Gross per 24 hour  Intake   1305 ml  Output      0 ml  Net   1305 ml   Filed Weights   06/10/14 0530 06/11/14 0450 06/12/14 0529  Weight: 73.378 kg (161 lb 12.3 oz) 75.161 kg (165 lb 11.2 oz) 77.202 kg (170 lb 3.2 oz)    Exam:   General:  A+Ox3, NAD  Cardiovascular: rrr without murmurs gallops rubs  Respiratory: clear to auscultation bilaterally without wheezes rhonchi or  rales  Abdomen: +BS, soft nontender nondistended  Musculoskeletal: No edema. No clubbing or cyanosis.  Data Reviewed: Basic Metabolic Panel:  Recent Labs Lab 06/09/14 1740 06/10/14 0330 06/11/14 1107  NA 139 140 135  K 3.7 3.5 3.6  CL 106 107 106  CO2 24 24 18*  GLUCOSE 75 178* 287*  BUN 50* 43* 27*  CREATININE 1.74* 1.44* 1.35*  CALCIUM 9.7 9.0 8.0*   Liver Function Tests:  Recent Labs Lab 06/09/14 1740 06/10/14 0330  AST 34 38*  ALT 46* 56*  ALKPHOS 60 53  BILITOT 0.4 0.7  PROT 5.8* 5.3*  ALBUMIN 3.0* 2.7*   No results for input(s): LIPASE, AMYLASE in the last 168 hours. No results for input(s): AMMONIA in the last 168 hours. CBC:  Recent Labs Lab 06/09/14 1740 06/10/14 0330 06/11/14 1107  WBC 16.3* 12.0* 11.6*  NEUTROABS 13.4*  --   --   HGB 11.9* 11.2* 10.9*  HCT 35.4* 33.8* 32.4*  MCV 85.5 86.0 87.3  PLT 212 228 212   Cardiac Enzymes:  Recent Labs Lab 06/09/14 2208 06/10/14 0330 06/10/14 0850 06/11/14 1107  TROPONINI 0.03 0.03 0.05* 0.06*   BNP (last 3 results) No results for input(s): PROBNP in the last 8760 hours. CBG:  Recent Labs Lab 06/11/14 1111 06/11/14 1613 06/11/14 2129 06/12/14 0715 06/12/14 1109  GLUCAP 238* 242* 229* 227* 258*    Recent Results (from the past 240 hour(s))  Urine culture     Status: None   Collection Time: 06/08/14 11:02 PM  Result Value Ref Range Status   Specimen Description URINE, CLEAN CATCH  Final   Special Requests Normal  Final   Colony Count   Final    >=100,000 COLONIES/ML Performed at [facility]    Culture   Final    PROTEUS MIRABILIS Performed at [facility]    Report Status 06/11/2014 FINAL  Final   Organism ID, Bacteria PROTEUS MIRABILIS  Final      Susceptibility   Proteus mirabilis - MIC*    AMPICILLIN <=2 SENSITIVE Sensitive     CEFAZOLIN <=4 SENSITIVE Sensitive     CEFTRIAXONE <=1 SENSITIVE Sensitive     CIPROFLOXACIN >=4 RESISTANT Resistant      GENTAMICIN <=1 SENSITIVE Sensitive     LEVOFLOXACIN >=8 RESISTANT Resistant     NITROFURANTOIN 128 RESISTANT Resistant     TOBRAMYCIN <=1 SENSITIVE Sensitive     TRIMETH/SULFA <=20 SENSITIVE Sensitive     PIP/TAZO <=4 SENSITIVE Sensitive     * PROTEUS MIRABILIS     Studies: No results found.  Scheduled Meds: . aspirin  81 mg Oral Daily  . cefTRIAXone (ROCEPHIN)  IV  1 g Intravenous Q24H  . clopidogrel  75 mg Oral Daily  . DULoxetine  60 mg Oral Daily  . folic acid  1 mg Oral Daily  . heparin  5,000 Units Subcutaneous 3 times per day  . insulin aspart  0-9 Units Subcutaneous TID WC  . linagliptin  5 mg Oral Daily  . multivitamin with minerals  1 tablet Oral Daily  . pravastatin  20 mg Oral Daily  . sodium chloride  3 mL Intravenous Q12H  . thiamine  100 mg Oral Daily   Continuous Infusions: . sodium chloride 75 mL/hr (06/12/14 0620)   Antibiotics Given (last 72 hours)    Date/Time Action Medication Dose Rate   06/10/14 1059 Given   cefTRIAXone (ROCEPHIN) 1 g in dextrose 5 % 50 mL IVPB - Premix 1 g 100 mL/hr   06/11/14 0954 Given   cefTRIAXone (ROCEPHIN) 1 g in dextrose 5 % 50 mL IVPB - Premix 1 g 100 mL/hr   06/12/14 0947 Given   cefTRIAXone (ROCEPHIN) 1 g in dextrose 5 % 50 mL IVPB - Premix 1 g 100 mL/hr     Time spent: 25 min  Esmond Hinch L  [facility] [email or web address], password [facility] 06/12/2014, 11:37 AM  LOS: 3 days

## 2014-06-13 LAB — GLUCOSE, CAPILLARY
GLUCOSE-CAPILLARY: 304 mg/dL — AB (ref 70–99)
Glucose-Capillary: 289 mg/dL — ABNORMAL HIGH (ref 70–99)

## 2014-06-13 MED ORDER — ACETAMINOPHEN 325 MG PO TABS: 650.0000 mg | ORAL_TABLET | Freq: Four times a day (QID) | ORAL | Status: AC | PRN

## 2014-06-13 MED ORDER — INSULIN GLARGINE 100 UNIT/ML ~~LOC~~ SOLN: 30.0000 [IU] | Freq: Every day | SUBCUTANEOUS | Status: AC

## 2014-06-13 MED ORDER — CEPHALEXIN 500 MG PO CAPS
500.0000 mg | ORAL_CAPSULE | Freq: Two times a day (BID) | ORAL | Status: DC
Start: 2014-06-13 — End: 2014-07-30

## 2014-06-13 MED ORDER — FUROSEMIDE 20 MG PO TABS: 20.0000 mg | ORAL_TABLET | ORAL | Status: AC

## 2014-06-13 MED ORDER — LOSARTAN POTASSIUM 25 MG PO TABS: 25.0000 mg | ORAL_TABLET | Freq: Every day | ORAL | Status: AC

## 2014-06-13 NOTE — Care Management Note (Signed)
    Page 1 of 1   06/13/2014     1:36:30 PM CARE MANAGEMENT NOTE 06/13/2014  Patient:  Leslie Delgado,Leslie Delgado   Account Number:  [id number]  Date Initiated:  06/13/2014  Documentation initiated by:  GRAVES-BIGELOW,Kohl Polinsky  Subjective/Objective Assessment:   Pt admitted for syncope, dehydration and UTI. Plan for SNF today.     Action/Plan:   CSW assisting with disposition needs. No needs from CM at this time.   Anticipated DC Date:  06/13/2014   Anticipated DC Plan:  SKILLED NURSING FACILITY  In-house referral  Clinical Social Worker      DC Planning Services  CM consult      Choice offered to / List presented to:             Status of service:  Completed, signed off Medicare Important Message given?  YES (If response is "NO", the following Medicare IM given date fields will be blank) Date Medicare IM given:  06/13/2014 Medicare IM given by:  GRAVES-BIGELOW,Alver Leete Date Additional Medicare IM given:   Additional Medicare IM given by:    Discharge Disposition:  SKILLED NURSING FACILITY  Per UR Regulation:  Reviewed for med. necessity/level of care/duration of stay  If discussed at Long Length of Stay Meetings, dates discussed:   06/14/2014    Comments:

## 2014-06-13 NOTE — Progress Notes (Signed)
CSW (Clinical Social Worker) prepared pt dc packet and placed with shadow chart. CSW arranged non-emergent ambulance transport. Pt, pt family, pt nurse, and facility informed. CSW signing off.  Princesa Willig, LCSWA [phone]  

## 2014-06-13 NOTE — Progress Notes (Signed)
CSW (Clinical Child psychotherapistocial Worker) confirmed with [facility] admissions staff that they can accept pt today.  Leslie Delgado, LCSWA [phone]

## 2014-06-13 NOTE — Discharge Summary (Signed)
Physician Discharge Summary  Leslie Delgado WUJ:[id number] DOB: 05/25/1921 DOA: 06/09/2014  PCP: Juline Patch, MD  Admit date: 06/09/2014 Discharge date: 06/13/2014  Time spent: Greater than 30 minutes  Recommendations for Outpatient Follow-up:  1. Monitor BMET, may need to adjust Lasix, ARB depending on creatinine, blood pressure, fluid status. 2. Daily weights 3. CBG every before meals and at bedtime, optimize diabetes control.  Discharge Diagnoses:  Principal problem Syncope Active Problems:   Dehydration   UTI (lower urinary tract infection), Proteus mirabilis   Hypotension   Chronic systolic heart failure, compensated. Echo July 2015 showed ejection fraction of 25-30%   CKD (chronic kidney disease) stage 3, GFR 30-59 ml/min Acute kidney injury   DM (diabetes mellitus), type 2, uncontrolled, with renal complications   Elevated troponin, without evidence of MI  Discharge Condition: Stable  Diet recommendation: Heart healthy carbohydrate modified  Filed Weights   06/10/14 0530 06/11/14 0450 06/12/14 0529  Weight: 73.378 kg (161 lb 12.3 oz) 75.161 kg (165 lb 11.2 oz) 77.202 kg (170 lb 3.2 oz)    History of present illness:  81 y.o. female presents with weakness and a syncopal episode. Patients daughter states they were getting out of the car and was unconscious. Patient does not recall the events. She was here yesterday and found a UTI. Patient was noted to be hypotensive. She apparently fell yesterday and today. Patient states that she has no headaches. She has no chest pain. She has not injured any extremities. No seizures noted. She did turn blue for a short time according to herr daughter. The daughter states that she gave her some rescue breaths. She does have chronic back pain. She is normally independent and gets around with a some help with a caretaker.   Hospital Course:  Admitted to telemetry.  Syncope- suspect related to intravascular volume depletion and urinary  tract infection. There were some reports of possible hypotension prior to arriving in the emergency room. Creatinine and BUN were increased.  Coreg, Cozaar and Lasix were held. She received IV hydration briefly. She remained in normal sinus rhythm on telemetry. She had no further syncope.  She worked with physical therapy who are recommending skilled nursing facility. Patient is stable for transfer today.  Elevated troponin, minimal -no CP, EKG unchanged. Doubt ACS.   AKI Resolving. Has h/o CKD 3. Will require monitoring basic metabolic panel at skilled nursing facility.  Dehydration -as noted by elevated creatinine euvolemic currently after IV hydration, and holding Lasix during admission. Will resume Lasix every other day rather than daily.  HYPOtension Resolved. Tolerating carvedilol low-dose. Will resume Cozaar at discharge. Would hold Cozaar for blood pressures less than 100 systolic.  UTI: Culture grew Proteus mirabilis. Sensitive to Rocephin which was started on admission. Also sensitive to cefazolin. Changed to Keflex. Would continue through 06/14/2014 then stop to complete about a 5 day course. Patient had no dysuria, but may have contributed to her syncope.  Diabetes mellitus A1C: 8.4. CBGs remained high. Started on Lantus at 25 units nightly (Trujeo had been prescribed prior to admission but patient had not started it yet per pharmacy.  CBGs remained in the 200s. Recommend increasing Lantus to 30 units and adjusting further at skilled nursing facility as needed. Also receives sliding scale NovoLog which can continue at skilled nursing facility.  Chronic systolic HF, last ejection fraction 25-30%. Compensated. Lasix held during admission. May resume at discharge. Would start every other day and adjust as needed based on fluid status, symptoms, creatinine. Would  monitor daily weights. On admission, Coreg and Cozaar were held.  Coreg has been resumed and patient tolerating well. Blood  pressure today is 140/60 and would resume Cozaar at discharge with hold parameters.  Procedures: None  Consultations:  None  Discharge Exam: Filed Vitals:   06/13/14 0824  BP: 140/60  Pulse:   Temp:   Resp:     General: In chair. Comfortable. Alert and oriented. Cardiovascular: Regular rate rhythm without murmurs gallops rubs Respiratory: Clear to auscultation bilaterally without wheeze rhonchi or rales Abdomen soft nontender nondistended Extremities no pitting edema  Discharge Instructions   Discharge Instructions    Diet - low sodium heart healthy    Complete by:  As directed      Diet Carb Modified    Complete by:  As directed      Walk with assistance    Complete by:  As directed           Current Discharge Medication List    START taking these medications   Details  acetaminophen (TYLENOL) 325 MG tablet Take 2 tablets (650 mg total) by mouth every 6 (six) hours as needed for mild pain (or Fever >/= 101).    insulin glargine (LANTUS) 100 UNIT/ML injection Inject 0.3 mLs (30 Units total) into the skin daily. Qty: 10 mL, Refills: 11      CONTINUE these medications which have CHANGED   Details  cephALEXin (KEFLEX) 500 MG capsule Take 1 capsule (500 mg total) by mouth every 12 (twelve) hours. Through 06/14/14, then stop    furosemide (LASIX) 20 MG tablet Take 1 tablet (20 mg total) by mouth every other day. Qty: 30 tablet    losartan (COZAAR) 25 MG tablet Take 1 tablet (25 mg total) by mouth daily. Hold for SBP below 100      CONTINUE these medications which have NOT CHANGED   Details  Alpha-Lipoic Acid 600 MG CAPS Take 600 mg by mouth daily.     aspirin 81 MG tablet Take 81 mg by mouth daily.    carvedilol (COREG) 3.125 MG tablet Take 1 tablet (3.125 mg total) by mouth 2 (two) times daily with a meal.    Cholecalciferol 2000 UNITS TABS Take 2,000 Units by mouth daily.    clopidogrel (PLAVIX) 75 MG tablet Take 75 mg by mouth daily.    DULoxetine  (CYMBALTA) 60 MG capsule Take 60 mg by mouth daily.    fish oil-omega-3 fatty acids 1000 MG capsule Take 1 g by mouth daily.     Multiple Vitamins-Minerals (MULTIVITAMIN WITH MINERALS) tablet Take 1 tablet by mouth daily.    pravastatin (PRAVACHOL) 20 MG tablet Take 20 mg by mouth daily.    sitaGLIPtin (JANUVIA) 100 MG tablet Take 100 mg by mouth daily.    nitroGLYCERIN (NITROSTAT) 0.4 MG SL tablet Place 0.4 mg under the tongue every 5 (five) minutes as needed for chest pain.      STOP taking these medications     insulin lispro (HUMALOG KWIKPEN) 100 UNIT/ML KiwkPen      Insulin Glargine 300 UNIT/ML SOPN        Allergies  Allergen Reactions  . Lotensin [Benazepril Hcl] Other (See Comments)    Other   . Penicillins Itching and Rash  . Procaine Other (See Comments)    unknown  . Sulfa Antibiotics Rash   Follow-up Information    Follow up with PANG,RICHARD, MD In 4 weeks.   Specialty:  Internal Medicine   Contact information:  [address] [place] [address] [phone]        The results of significant diagnostics from this hospitalization (including imaging, microbiology, ancillary and laboratory) are listed below for reference.    Significant Diagnostic Studies: Dg Chest 2 View  06/09/2014   CLINICAL DATA:  81 year old female [place] near syncope  EXAM: CHEST  2 VIEW  COMPARISON:  Prior chest x-ray 11/30/2013  FINDINGS: Stable borderline cardiomegaly. Atherosclerotic calcifications present in the transverse aorta. Chronic bronchitic changes and interstitial prominence are similar compared to prior. No focal airspace consolidation, pulmonary edema, pleural effusion or pneumothorax. Stable remote left proximal humeral fracture and thoracolumbar vertebral body fractures. No acute osseous abnormality.  IMPRESSION: Stable chest x-ray without evidence for acute cardiopulmonary disease.   Electronically Signed   By: Malachy Moan M.D.   On:  06/09/2014 18:43   Ct Head Wo Contrast  06/09/2014   CLINICAL DATA:  Syncopal episode while sitting in the car.  EXAM: CT HEAD WITHOUT CONTRAST  TECHNIQUE: Contiguous axial images were obtained from the base of the skull through the vertex without intravenous contrast.  COMPARISON:  06/08/2014, 06/03/2009  FINDINGS: There is no evidence of mass effect, midline shift, or extra-axial fluid collections. There is no evidence of a space-occupying lesion or intracranial hemorrhage. There is no evidence of a cortical-based area of acute infarction. Left posterior frontal encephalomalacia likely related to prior insult. There is generalized cerebral atrophy. There is periventricular white matter low attenuation likely secondary to microangiopathy.  The ventricles and sulci are appropriate for the patient's age. The basal cisterns are patent.  Visualized portions of the orbits are unremarkable. The visualized portions of the paranasal sinuses and mastoid air cells are unremarkable. Cerebrovascular atherosclerotic calcifications are noted.  The osseous structures are unremarkable. Small left parietal scalp hematoma.  IMPRESSION: 1. No acute intracranial pathology. 2. Chronic microvascular disease and cerebral atrophy.   Electronically Signed   By: Elige Ko   On: 06/09/2014 19:24   Ct Head Wo Contrast  06/08/2014   CLINICAL DATA:  Frequent falls, fall today at home. RIGHT forehead hematoma.  EXAM: CT HEAD WITHOUT CONTRAST  TECHNIQUE: Contiguous axial images were obtained from the base of the skull through the vertex without intravenous contrast.  COMPARISON:  CT of the head June 03, 2009  FINDINGS: The ventricles and sulci are normal for age. No intraparenchymal hemorrhage, mass effect nor midline shift. Patchy supratentorial white matter hypodensities are within normal range for patient's age and though non-specific suggest sequelae of chronic small vessel ischemic disease. No acute large vascular territory  infarcts. LEFT posterior frontal encephalomalacia, present on prior examination.  No abnormal extra-axial fluid collections. Basal cisterns are patent. Mild calcific atherosclerosis of the carotid siphons and included vertebral arteries.  Moderate LEFT parietal scalp hematoma. No skull fracture. The included ocular globes and orbital contents are non-suspicious. Bilateral scleral banding. The mastoid aircells and included paranasal sinuses are well-aerated.  IMPRESSION: Moderate LEFT parietal scalp hematoma. No skull fracture. No acute intracranial process.  Involutional changes. LEFT frontal encephalomalacia may represent remote middle cerebral artery territory infarct. Mild to moderate white matter changes can be seen with chronic small vessel ischemic disease.   Electronically Signed   By: Awilda Metro   On: 06/08/2014 21:53   EKG Sinus or ectopic atrial rhythm Ventricular premature complex Prolonged PR interval Left ventricular hypertrophy Unchanged from previous  Microbiology: Recent Results (from the past 240 hour(s))  Urine culture     Status: None  Collection Time: 06/08/14 11:02 PM  Result Value Ref Range Status   Specimen Description URINE, CLEAN CATCH  Final   Special Requests Normal  Final   Colony Count   Final    >=100,000 COLONIES/ML Performed at [facility]    Culture   Final    PROTEUS MIRABILIS Performed at [facility]    Report Status 06/11/2014 FINAL  Final   Organism ID, Bacteria PROTEUS MIRABILIS  Final      Susceptibility   Proteus mirabilis - MIC*    AMPICILLIN <=2 SENSITIVE Sensitive     CEFAZOLIN <=4 SENSITIVE Sensitive     CEFTRIAXONE <=1 SENSITIVE Sensitive     CIPROFLOXACIN >=4 RESISTANT Resistant     GENTAMICIN <=1 SENSITIVE Sensitive     LEVOFLOXACIN >=8 RESISTANT Resistant     NITROFURANTOIN 128 RESISTANT Resistant     TOBRAMYCIN <=1 SENSITIVE Sensitive     TRIMETH/SULFA <=20 SENSITIVE Sensitive     PIP/TAZO <=4 SENSITIVE  Sensitive     * PROTEUS MIRABILIS     Labs: Basic Metabolic Panel:  Recent Labs Lab 06/09/14 1740 06/10/14 0330 06/11/14 1107  NA 139 140 135  K 3.7 3.5 3.6  CL 106 107 106  CO2 24 24 18*  GLUCOSE 75 178* 287*  BUN 50* 43* 27*  CREATININE 1.74* 1.44* 1.35*  CALCIUM 9.7 9.0 8.0*   Liver Function Tests:  Recent Labs Lab 06/09/14 1740 06/10/14 0330  AST 34 38*  ALT 46* 56*  ALKPHOS 60 53  BILITOT 0.4 0.7  PROT 5.8* 5.3*  ALBUMIN 3.0* 2.7*   No results for input(s): LIPASE, AMYLASE in the last 168 hours. No results for input(s): AMMONIA in the last 168 hours. CBC:  Recent Labs Lab 06/09/14 1740 06/10/14 0330 06/11/14 1107  WBC 16.3* 12.0* 11.6*  NEUTROABS 13.4*  --   --   HGB 11.9* 11.2* 10.9*  HCT 35.4* 33.8* 32.4*  MCV 85.5 86.0 87.3  PLT 212 228 212   Cardiac Enzymes:  Recent Labs Lab 06/09/14 2208 06/10/14 0330 06/10/14 0850 06/11/14 1107  TROPONINI 0.03 0.03 0.05* 0.06*   BNP: BNP (last 3 results) No results for input(s): PROBNP in the last 8760 hours. CBG:  Recent Labs Lab 06/12/14 0715 06/12/14 1109 06/12/14 1613 06/12/14 2032 06/13/14 0732  GLUCAP 227* 258* 272* 223* 289*       Signed:  Edell Mesenbrink L  [facility] 06/13/2014, 11:34 AM

## 2014-06-13 NOTE — Progress Notes (Signed)
Inpatient Diabetes Program Recommendations  AACE/ADA: New Consensus Statement on Inpatient Glycemic Control (2013)  Target Ranges:  Prepandial:   less than 140 mg/dL      Peak postprandial:   less than 180 mg/dL (1-2 hours)      Critically ill patients:  140 - 180 mg/dL     Results for Signe ColtHECHT, Arriyanna B (MRN [id number]) as of 06/13/2014 10:05  Ref. Range 06/12/2014 07:15 06/12/2014 11:09 06/12/2014 16:13 06/12/2014 20:32  Glucose-Capillary Latest Range: 70-99 mg/dL 147227 (H) 829258 (H) 562272 (H) 223 (H)    Results for Signe ColtHECHT, Lydia B (MRN [id number]) as of 06/13/2014 10:05  Ref. Range 06/13/2014 07:32  Glucose-Capillary Latest Range: 70-99 mg/dL 696289 (H)    Home DM Meds: Lantus 25 units daily       Humalog tidwc per SSI         Januvia 100 mg daily   Current Orders: Lantus 25 units daily      Novolog Sensitive SSI      Tradjenta 5 mg daily   **Glucose levels elevated above goal.  Having both elevated fasting and postprandial glucose levels.   MD- Please consider the following in-hospital insulin adjustments:  1. Increase Lantus to 30 units daily (20% increase) 2. Add Novolog Meal Coverage- Novolog 3 units tid with meals     Will follow Ambrose FinlandJeannine Johnston Clint Biello RN, MSN, CDE Diabetes Coordinator Inpatient Diabetes Program Team Pager: [phone] (8a-10p)

## 2014-07-30 ENCOUNTER — Inpatient Hospital Stay (HOSPITAL_COMMUNITY): Payer: Medicare Other

## 2014-07-30 ENCOUNTER — Inpatient Hospital Stay (HOSPITAL_COMMUNITY)
Admission: EM | Admit: 2014-07-30 | Discharge: 2014-08-19 | DRG: 871 | Disposition: E | Payer: Medicare Other | Attending: Internal Medicine | Admitting: Internal Medicine

## 2014-07-30 ENCOUNTER — Emergency Department (HOSPITAL_COMMUNITY): Payer: Medicare Other

## 2014-07-30 ENCOUNTER — Encounter (HOSPITAL_COMMUNITY): Payer: Self-pay

## 2014-07-30 DIAGNOSIS — J690 Pneumonitis due to inhalation of food and vomit: Secondary | ICD-10-CM | POA: Diagnosis not present

## 2014-07-30 DIAGNOSIS — J189 Pneumonia, unspecified organism: Secondary | ICD-10-CM

## 2014-07-30 DIAGNOSIS — Z7902 Long term (current) use of antithrombotics/antiplatelets: Secondary | ICD-10-CM

## 2014-07-30 DIAGNOSIS — R6520 Severe sepsis without septic shock: Secondary | ICD-10-CM | POA: Diagnosis present

## 2014-07-30 DIAGNOSIS — I5022 Chronic systolic (congestive) heart failure: Secondary | ICD-10-CM | POA: Diagnosis not present

## 2014-07-30 DIAGNOSIS — Z7982 Long term (current) use of aspirin: Secondary | ICD-10-CM | POA: Diagnosis not present

## 2014-07-30 DIAGNOSIS — N183 Chronic kidney disease, stage 3 unspecified: Secondary | ICD-10-CM | POA: Diagnosis present

## 2014-07-30 DIAGNOSIS — Z880 Allergy status to penicillin: Secondary | ICD-10-CM

## 2014-07-30 DIAGNOSIS — Z794 Long term (current) use of insulin: Secondary | ICD-10-CM | POA: Diagnosis not present

## 2014-07-30 DIAGNOSIS — IMO0002 Reserved for concepts with insufficient information to code with codable children: Secondary | ICD-10-CM | POA: Diagnosis present

## 2014-07-30 DIAGNOSIS — E1165 Type 2 diabetes mellitus with hyperglycemia: Secondary | ICD-10-CM | POA: Diagnosis present

## 2014-07-30 DIAGNOSIS — J9601 Acute respiratory failure with hypoxia: Secondary | ICD-10-CM | POA: Diagnosis present

## 2014-07-30 DIAGNOSIS — Z8249 Family history of ischemic heart disease and other diseases of the circulatory system: Secondary | ICD-10-CM

## 2014-07-30 DIAGNOSIS — B9620 Unspecified Escherichia coli [E. coli] as the cause of diseases classified elsewhere: Secondary | ICD-10-CM | POA: Diagnosis present

## 2014-07-30 DIAGNOSIS — A419 Sepsis, unspecified organism: Secondary | ICD-10-CM | POA: Diagnosis not present

## 2014-07-30 DIAGNOSIS — I4891 Unspecified atrial fibrillation: Secondary | ICD-10-CM | POA: Diagnosis present

## 2014-07-30 DIAGNOSIS — I129 Hypertensive chronic kidney disease with stage 1 through stage 4 chronic kidney disease, or unspecified chronic kidney disease: Secondary | ICD-10-CM | POA: Diagnosis present

## 2014-07-30 DIAGNOSIS — M810 Age-related osteoporosis without current pathological fracture: Secondary | ICD-10-CM | POA: Diagnosis present

## 2014-07-30 DIAGNOSIS — Z881 Allergy status to other antibiotic agents status: Secondary | ICD-10-CM

## 2014-07-30 DIAGNOSIS — Z515 Encounter for palliative care: Secondary | ICD-10-CM

## 2014-07-30 DIAGNOSIS — R0902 Hypoxemia: Secondary | ICD-10-CM

## 2014-07-30 DIAGNOSIS — E1129 Type 2 diabetes mellitus with other diabetic kidney complication: Secondary | ICD-10-CM | POA: Diagnosis present

## 2014-07-30 DIAGNOSIS — E876 Hypokalemia: Secondary | ICD-10-CM | POA: Diagnosis present

## 2014-07-30 DIAGNOSIS — Z79899 Other long term (current) drug therapy: Secondary | ICD-10-CM | POA: Diagnosis not present

## 2014-07-30 DIAGNOSIS — Z66 Do not resuscitate: Secondary | ICD-10-CM | POA: Diagnosis present

## 2014-07-30 DIAGNOSIS — E1122 Type 2 diabetes mellitus with diabetic chronic kidney disease: Secondary | ICD-10-CM | POA: Diagnosis present

## 2014-07-30 DIAGNOSIS — I5023 Acute on chronic systolic (congestive) heart failure: Secondary | ICD-10-CM | POA: Diagnosis present

## 2014-07-30 DIAGNOSIS — N390 Urinary tract infection, site not specified: Secondary | ICD-10-CM | POA: Diagnosis present

## 2014-07-30 DIAGNOSIS — I252 Old myocardial infarction: Secondary | ICD-10-CM | POA: Diagnosis not present

## 2014-07-30 LAB — I-STAT TROPONIN, ED: TROPONIN I, POC: 0.05 ng/mL (ref 0.00–0.08)

## 2014-07-30 LAB — COMPREHENSIVE METABOLIC PANEL
ALK PHOS: 98 U/L (ref 39–117)
ALT: 137 U/L — ABNORMAL HIGH (ref 0–35)
AST: 130 U/L — ABNORMAL HIGH (ref 0–37)
Albumin: 2.5 g/dL — ABNORMAL LOW (ref 3.5–5.2)
Anion gap: 10 (ref 5–15)
BUN: 33 mg/dL — AB (ref 6–23)
CHLORIDE: 100 mmol/L (ref 96–112)
CO2: 28 mmol/L (ref 19–32)
CREATININE: 1.44 mg/dL — AB (ref 0.50–1.10)
Calcium: 8.4 mg/dL (ref 8.4–10.5)
GFR calc Af Amer: 36 mL/min — ABNORMAL LOW (ref >90)
GFR calc non Af Amer: 31 mL/min — ABNORMAL LOW (ref >90)
Glucose, Bld: 172 mg/dL — ABNORMAL HIGH (ref 70–99)
POTASSIUM: 4.4 mmol/L (ref 3.5–5.1)
SODIUM: 138 mmol/L (ref 135–145)
Total Bilirubin: 1.8 mg/dL — ABNORMAL HIGH (ref 0.3–1.2)
Total Protein: 6.3 g/dL (ref 6.0–8.3)

## 2014-07-30 LAB — URINALYSIS, ROUTINE W REFLEX MICROSCOPIC
BILIRUBIN URINE: NEGATIVE
GLUCOSE, UA: NEGATIVE mg/dL
Ketones, ur: NEGATIVE mg/dL
NITRITE: POSITIVE — AB
PH: 5.5 (ref 5.0–8.0)
Protein, ur: NEGATIVE mg/dL
Specific Gravity, Urine: 1.014 (ref 1.005–1.030)
Urobilinogen, UA: 0.2 mg/dL (ref 0.0–1.0)

## 2014-07-30 LAB — CBC
HEMATOCRIT: 39.5 % (ref 36.0–46.0)
HEMOGLOBIN: 12.9 g/dL (ref 12.0–15.0)
MCH: 28 pg (ref 26.0–34.0)
MCHC: 32.7 g/dL (ref 30.0–36.0)
MCV: 85.9 fL (ref 78.0–100.0)
PLATELETS: 490 10*3/uL — AB (ref 150–400)
RBC: 4.6 MIL/uL (ref 3.87–5.11)
RDW: 15.2 % (ref 11.5–15.5)
WBC: 38.8 10*3/uL — ABNORMAL HIGH (ref 4.0–10.5)

## 2014-07-30 LAB — URINE MICROSCOPIC-ADD ON

## 2014-07-30 LAB — INFLUENZA PANEL BY PCR (TYPE A & B)
H1N1FLUPCR: NOT DETECTED
INFLAPCR: NEGATIVE
Influenza B By PCR: NEGATIVE

## 2014-07-30 LAB — I-STAT CHEM 8, ED
BUN: 48 mg/dL — ABNORMAL HIGH (ref 6–23)
CALCIUM ION: 1.11 mmol/L — AB (ref 1.13–1.30)
Chloride: 101 mmol/L (ref 96–112)
Creatinine, Ser: 1.4 mg/dL — ABNORMAL HIGH (ref 0.50–1.10)
GLUCOSE: 174 mg/dL — AB (ref 70–99)
HCT: 43 % (ref 36.0–46.0)
Hemoglobin: 14.6 g/dL (ref 12.0–15.0)
POTASSIUM: 4 mmol/L (ref 3.5–5.1)
SODIUM: 139 mmol/L (ref 135–145)
TCO2: 25 mmol/L (ref 0–100)

## 2014-07-30 LAB — I-STAT ARTERIAL BLOOD GAS, ED
ACID-BASE DEFICIT: 3 mmol/L — AB (ref 0.0–2.0)
Bicarbonate: 23.5 mEq/L (ref 20.0–24.0)
O2 Saturation: 100 %
PCO2 ART: 46.7 mmHg — AB (ref 35.0–45.0)
PH ART: 7.31 — AB (ref 7.350–7.450)
PO2 ART: 324 mmHg — AB (ref 80.0–100.0)
Patient temperature: 98.6
TCO2: 25 mmol/L (ref 0–100)

## 2014-07-30 LAB — BRAIN NATRIURETIC PEPTIDE: B Natriuretic Peptide: 449.2 pg/mL — ABNORMAL HIGH (ref 0.0–100.0)

## 2014-07-30 LAB — LACTIC ACID, PLASMA
Lactic Acid, Venous: 2.3 mmol/L (ref 0.5–2.0)
Lactic Acid, Venous: 1.2 mmol/L (ref 0.5–2.0)

## 2014-07-30 LAB — I-STAT CG4 LACTIC ACID, ED: Lactic Acid, Venous: 3.75 mmol/L (ref 0.5–2.0)

## 2014-07-30 LAB — MRSA PCR SCREENING: MRSA by PCR: NEGATIVE

## 2014-07-30 LAB — PROCALCITONIN: PROCALCITONIN: 3.06 ng/mL

## 2014-07-30 LAB — MAGNESIUM: MAGNESIUM: 2.3 mg/dL (ref 1.5–2.5)

## 2014-07-30 LAB — PROTIME-INR
INR: 1.2 (ref 0.00–1.49)
PROTHROMBIN TIME: 15.4 s — AB (ref 11.6–15.2)

## 2014-07-30 MED ORDER — FUROSEMIDE 10 MG/ML IJ SOLN
40.0000 mg | Freq: Two times a day (BID) | INTRAMUSCULAR | Status: DC
  Administered 2014-07-31: 40 mg via INTRAVENOUS
  Filled 2014-07-30 (×3): qty 4

## 2014-07-30 MED ORDER — DILTIAZEM HCL 25 MG/5ML IV SOLN
10.0000 mg | Freq: Once | INTRAVENOUS | Status: AC
  Administered 2014-07-30: 10 mg via INTRAVENOUS
  Filled 2014-07-30: qty 5

## 2014-07-30 MED ORDER — ASPIRIN 300 MG RE SUPP
150.0000 mg | Freq: Every day | RECTAL | Status: DC
  Filled 2014-07-30: qty 1

## 2014-07-30 MED ORDER — CHLORHEXIDINE GLUCONATE 0.12 % MT SOLN
15.0000 mL | Freq: Two times a day (BID) | OROMUCOSAL | Status: DC
  Administered 2014-07-30 – 2014-08-01 (×3): 15 mL via OROMUCOSAL
  Filled 2014-07-30 (×6): qty 15

## 2014-07-30 MED ORDER — LORAZEPAM 2 MG/ML IJ SOLN
0.5000 mg | Freq: Four times a day (QID) | INTRAMUSCULAR | Status: DC | PRN
  Administered 2014-07-31: 0.5 mg via INTRAVENOUS
  Filled 2014-07-30 (×2): qty 1

## 2014-07-30 MED ORDER — SODIUM CHLORIDE 0.9 % IV SOLN: 250.0000 mL | INTRAVENOUS | Status: DC | PRN

## 2014-07-30 MED ORDER — HYDRALAZINE HCL 20 MG/ML IJ SOLN: 10.0000 mg | Freq: Four times a day (QID) | INTRAMUSCULAR | Status: DC | PRN

## 2014-07-30 MED ORDER — SODIUM CHLORIDE 0.9 % IJ SOLN: 3.0000 mL | INTRAMUSCULAR | Status: DC | PRN

## 2014-07-30 MED ORDER — ACETAMINOPHEN 325 MG PO TABS: 650.0000 mg | ORAL_TABLET | Freq: Four times a day (QID) | ORAL | Status: DC | PRN

## 2014-07-30 MED ORDER — FUROSEMIDE 10 MG/ML IJ SOLN
INTRAMUSCULAR | Status: AC
  Filled 2014-07-30: qty 4

## 2014-07-30 MED ORDER — MORPHINE SULFATE 2 MG/ML IJ SOLN
1.0000 mg | INTRAMUSCULAR | Status: DC | PRN
  Administered 2014-07-30 – 2014-07-31 (×2): 1 mg via INTRAVENOUS
  Filled 2014-07-30 (×2): qty 1

## 2014-07-30 MED ORDER — INSULIN GLARGINE 100 UNIT/ML ~~LOC~~ SOLN
10.0000 [IU] | Freq: Every day | SUBCUTANEOUS | Status: DC
  Administered 2014-07-30 – 2014-07-31 (×2): 10 [IU] via SUBCUTANEOUS
  Filled 2014-07-30 (×2): qty 0.1

## 2014-07-30 MED ORDER — FUROSEMIDE 10 MG/ML IJ SOLN
40.0000 mg | Freq: Once | INTRAMUSCULAR | Status: AC
  Administered 2014-07-30: 40 mg via INTRAVENOUS
  Filled 2014-07-30: qty 4

## 2014-07-30 MED ORDER — NITROGLYCERIN IN D5W 200-5 MCG/ML-% IV SOLN
5.0000 ug/min | Freq: Once | INTRAVENOUS | Status: DC
  Filled 2014-07-30: qty 250

## 2014-07-30 MED ORDER — FUROSEMIDE 10 MG/ML IJ SOLN: 40.0000 mg | Freq: Once | INTRAMUSCULAR | Status: AC

## 2014-07-30 MED ORDER — METOPROLOL TARTRATE 1 MG/ML IV SOLN: 5.0000 mg | Freq: Four times a day (QID) | INTRAVENOUS | Status: DC

## 2014-07-30 MED ORDER — SODIUM CHLORIDE 0.9 % IV SOLN
INTRAVENOUS | Status: AC
  Administered 2014-07-30 (×2): 125 mL/h via INTRAVENOUS

## 2014-07-30 MED ORDER — VANCOMYCIN HCL 10 G IV SOLR
1500.0000 mg | INTRAVENOUS | Status: AC
  Administered 2014-07-30: 1500 mg via INTRAVENOUS
  Filled 2014-07-30: qty 1500

## 2014-07-30 MED ORDER — CETYLPYRIDINIUM CHLORIDE 0.05 % MT LIQD: 7.0000 mL | Freq: Two times a day (BID) | OROMUCOSAL | Status: DC

## 2014-07-30 MED ORDER — AZTREONAM 1 G IJ SOLR
1.0000 g | Freq: Three times a day (TID) | INTRAMUSCULAR | Status: DC
  Administered 2014-07-30 – 2014-07-31 (×2): 1 g via INTRAVENOUS
  Filled 2014-07-30 (×4): qty 1

## 2014-07-30 MED ORDER — SODIUM CHLORIDE 0.9 % IV BOLUS (SEPSIS): 1000.0000 mL | Freq: Once | INTRAVENOUS | Status: DC

## 2014-07-30 MED ORDER — ONDANSETRON HCL 4 MG/2ML IJ SOLN: 4.0000 mg | Freq: Four times a day (QID) | INTRAMUSCULAR | Status: DC | PRN

## 2014-07-30 MED ORDER — METOPROLOL TARTRATE 1 MG/ML IV SOLN
5.0000 mg | Freq: Four times a day (QID) | INTRAVENOUS | Status: DC
  Administered 2014-07-30 – 2014-07-31 (×4): 5 mg via INTRAVENOUS
  Filled 2014-07-30 (×7): qty 5

## 2014-07-30 MED ORDER — ONDANSETRON HCL 4 MG PO TABS: 4.0000 mg | ORAL_TABLET | Freq: Four times a day (QID) | ORAL | Status: DC | PRN

## 2014-07-30 MED ORDER — OSELTAMIVIR PHOSPHATE 75 MG PO CAPS
75.0000 mg | ORAL_CAPSULE | Freq: Two times a day (BID) | ORAL | Status: DC
  Administered 2014-07-30: 75 mg via ORAL
  Filled 2014-07-30 (×2): qty 1

## 2014-07-30 MED ORDER — ACETAMINOPHEN 650 MG RE SUPP: 650.0000 mg | Freq: Four times a day (QID) | RECTAL | Status: DC | PRN

## 2014-07-30 MED ORDER — SODIUM CHLORIDE 0.9 % IJ SOLN
3.0000 mL | Freq: Two times a day (BID) | INTRAMUSCULAR | Status: DC
  Administered 2014-07-30 – 2014-08-01 (×4): 3 mL via INTRAVENOUS

## 2014-07-30 MED ORDER — DILTIAZEM HCL 25 MG/5ML IV SOLN
10.0000 mg | Freq: Four times a day (QID) | INTRAVENOUS | Status: DC | PRN
  Filled 2014-07-30: qty 5

## 2014-07-30 MED ORDER — NITROGLYCERIN 0.4 MG SL SUBL: 0.4000 mg | SUBLINGUAL_TABLET | SUBLINGUAL | Status: DC | PRN

## 2014-07-30 MED ORDER — SODIUM CHLORIDE 0.9 % IV BOLUS (SEPSIS)
1000.0000 mL | Freq: Once | INTRAVENOUS | Status: AC
  Administered 2014-07-30: 1000 mL via INTRAVENOUS

## 2014-07-30 MED ORDER — INSULIN ASPART 100 UNIT/ML ~~LOC~~ SOLN
0.0000 [IU] | SUBCUTANEOUS | Status: DC
  Administered 2014-07-30: 2 [IU] via SUBCUTANEOUS
  Administered 2014-07-30: 1 [IU] via SUBCUTANEOUS
  Administered 2014-07-30: 2 [IU] via SUBCUTANEOUS

## 2014-07-30 MED ORDER — VANCOMYCIN HCL IN DEXTROSE 1-5 GM/200ML-% IV SOLN
1000.0000 mg | INTRAVENOUS | Status: DC
  Administered 2014-07-31: 1000 mg via INTRAVENOUS
  Filled 2014-07-30 (×2): qty 200

## 2014-07-30 MED ORDER — VANCOMYCIN HCL IN DEXTROSE 1-5 GM/200ML-% IV SOLN: 1000.0000 mg | Freq: Once | INTRAVENOUS | Status: DC

## 2014-07-30 MED ORDER — ENOXAPARIN SODIUM 30 MG/0.3ML ~~LOC~~ SOLN
30.0000 mg | SUBCUTANEOUS | Status: DC
  Administered 2014-07-30: 30 mg via SUBCUTANEOUS
  Filled 2014-07-30 (×2): qty 0.3

## 2014-07-30 MED ORDER — LEVALBUTEROL HCL 0.63 MG/3ML IN NEBU
0.6300 mg | INHALATION_SOLUTION | Freq: Four times a day (QID) | RESPIRATORY_TRACT | Status: DC
  Administered 2014-07-30 – 2014-07-31 (×3): 0.63 mg via RESPIRATORY_TRACT
  Filled 2014-07-30 (×7): qty 3

## 2014-07-30 MED ORDER — SODIUM CHLORIDE 0.9 % IV BOLUS (SEPSIS)
500.0000 mL | INTRAVENOUS | Status: AC
  Administered 2014-07-30: 500 mL via INTRAVENOUS

## 2014-07-30 MED ORDER — DEXTROSE 5 % IV SOLN
2.0000 g | INTRAVENOUS | Status: AC
  Administered 2014-07-30: 2 g via INTRAVENOUS
  Filled 2014-07-30: qty 2

## 2014-07-30 MED ORDER — LORAZEPAM 2 MG/ML IJ SOLN
0.5000 mg | Freq: Once | INTRAMUSCULAR | Status: AC
  Administered 2014-07-30: 0.5 mg via INTRAVENOUS

## 2014-07-30 MED ORDER — SODIUM CHLORIDE 0.9 % IV BOLUS (SEPSIS)
1000.0000 mL | INTRAVENOUS | Status: AC
  Administered 2014-07-30: 1000 mL via INTRAVENOUS

## 2014-07-30 NOTE — Progress Notes (Signed)
SLP Cancellation Note  Patient Details Name: Leslie ColtRachel B Garde MRN: [id number] DOB: 04-08-1922   Cancelled treatment:        Pt not appropriate for swallow evaluation at this time, as pt currently requiring bipap. Discussed with RN. ST to continue efforts next date.  Celia B. Murvin NatalBueche, [facility] AthensMSP, CCC-SLP [phone]   Leigh AuroraBueche, Celia Brown 08/11/2014, 3:26 PM

## 2014-07-30 NOTE — Progress Notes (Signed)
This note also relates to the following rows which could not be included: Pulse Rate - Cannot attach notes to unvalidated device data SpO2 - Cannot attach notes to unvalidated device data   Placed back on bipap due to sob and aggitation.  Lasix was given by nurse and IV stopped

## 2014-07-30 NOTE — H&P (Signed)
[facility] History and Physical                                                                                    Leslie Delgado, is a 81 y.o. female  MRN: [id number]   DOB - 09/03/1921  Admit Date - 08/13/2014  Outpatient Primary MD for the patient is Juline Patch, MD  With History of -  Past Medical History  Diagnosis Date  . Cataract   . Heart murmur   . Hypertension   . Myocardial infarction   . Osteoporosis   . Diabetes mellitus without complication     TYPE 2  . Arthritis     IN FINGERS & KNEE      Past Surgical History  Procedure Laterality Date  . Appendectomy    . Eye surgery    . Abdominal hysterectomy      in for   Chief Complaint  Patient presents with  . Respiratory Distress     HPI  Leslie Delgado  is a 81 y.o. female, with a past medical history significant for diabetes mellitus, systolic heart failure, history of MI, hypertension and arthritis. She was sent to the emergency room this morning after being found in respiratory distress at her assisted living facility. The patient is alert but on a BiPAP so family assisted with history. Reportedly the patient was feeling absolutely fine until yesterday. She was hospitalized in January for a syncopal episode and was discharged to [facility]. One week ago she had graduated to the assisted living facility. At that time she was able to walk with a walker. Her daughter is unable to confirm whether or not she has been very ambulatory this week. The patient denies any recent vomiting, cough, fever, chest pain, abdominal pain, diarrhea. According to the emergency department physician the patient was found to be very unexpectedly in respiratory distress this morning with oxygen sats in the 60s. She remained hypoxic even after being placed on BiPAP.  In the ER the patient's blood pressure has waxed and waned from a systolic of 80-179 now at 120. Temperature 101.6. Respirations are 38. Pulse rate 126.  Her white count is 38.8 it was previously normal.  Chest x-ray shows pulmonary edema, underlying infection not excluded. Urinalysis appears positive for infection. The patient is a DO NOT RESUSCITATE, but unfortunately both she and her family seem surprised when the attending M.D. discussed the seriousness of her illness with them. She will be admitted to the stepdown unit on BiPAP, with antibiotics for aspiration pneumonia.  Review of Systems   Difficult to obtain from the patient as she is on BiPAP. A full 10 point Review of Systems was done, except as stated above, all other Review of Systems were negative.  Social History History  Substance Use Topics  . Smoking status: Never Smoker   . Smokeless tobacco: Never Used  . Alcohol Use: No  Lives at [facility].  Family History Family History  Problem Relation Age of Onset  . Heart disease Father   . Cancer Brother   . Heart disease Brother   Mother passed in her 83s with Heart disease.  Prior  to Admission medications   Medication Sig Start Date End Date Taking? Authorizing Provider  acetaminophen (TYLENOL) 325 MG tablet Take 2 tablets (650 mg total) by mouth every 6 (six) hours as needed for mild pain (or Fever >/= 101). 06/13/14   Christiane Haorinna L Sullivan, MD  Alpha-Lipoic Acid 600 MG CAPS Take 600 mg by mouth daily.     Historical Provider, MD  aspirin 81 MG tablet Take 81 mg by mouth daily.    Historical Provider, MD  carvedilol (COREG) 3.125 MG tablet Take 1 tablet (3.125 mg total) by mouth 2 (two) times daily with a meal. 12/03/13   Maretta BeesShanker M Ghimire, MD  Cholecalciferol 2000 UNITS TABS Take 2,000 Units by mouth daily.    Historical Provider, MD  clopidogrel (PLAVIX) 75 MG tablet Take 75 mg by mouth daily.    Historical Provider, MD  DULoxetine (CYMBALTA) 60 MG capsule Take 60 mg by mouth daily.    Historical Provider, MD  fish oil-omega-3 fatty acids 1000 MG capsule Take 1 g by mouth daily.     Historical Provider, MD   furosemide (LASIX) 20 MG tablet Take 1 tablet (20 mg total) by mouth every other day. 06/13/14   Christiane Haorinna L Sullivan, MD  insulin glargine (LANTUS) 100 UNIT/ML injection Inject 0.3 mLs (30 Units total) into the skin daily. 06/13/14   Christiane Haorinna L Sullivan, MD  losartan (COZAAR) 25 MG tablet Take 1 tablet (25 mg total) by mouth daily. Hold for SBP below 100 06/13/14   Christiane Haorinna L Sullivan, MD  Multiple Vitamins-Minerals (MULTIVITAMIN WITH MINERALS) tablet Take 1 tablet by mouth daily.    Historical Provider, MD  nitroGLYCERIN (NITROSTAT) 0.4 MG SL tablet Place 0.4 mg under the tongue every 5 (five) minutes as needed for chest pain.    Historical Provider, MD  pravastatin (PRAVACHOL) 20 MG tablet Take 20 mg by mouth daily.    Historical Provider, MD  sitaGLIPtin (JANUVIA) 100 MG tablet Take 100 mg by mouth daily.    Historical Provider, MD    Allergies  Allergen Reactions  . Lotensin [Benazepril Hcl] Other (See Comments)    Other   . Penicillins Itching and Rash  . Procaine Other (See Comments)    unknown  . Sulfa Antibiotics Rash    Physical Exam  Vitals  Blood pressure 120/46, pulse 66, temperature 101.6 F (38.7 C), temperature source Core (Comment), resp. rate 30, height 5\' 5"  (1.651 m), weight 71.668 kg (158 lb), SpO2 74 %.   General: Frail elderly female, alert, appropriate, lying in bed family at bedside.  Psych:  Normal affect and insight, Not Suicidal or Homicidal, Awake Alert, Oriented X 3.  Neuro:   No F.N deficits, ALL C.Nerves Intact, Strength 5/5 all 4 extremities, Sensation intact all 4 extremities.  ENT:  Ears and Eyes appear Normal, Conjunctivae clear, PER.   Neck:  Supple, No lymphadenopathy appreciated  Respiratory:  Symmetrical chest wall movement, Some expiratory wheeze  and crackles.  Mildly increased work of breathing (currently on bipap)  Cardiac:  Tachycardic, no frank M/R/G.  +2+ pitting in LE bilaterally.  No JVD noted.  Abdomen:  Mildly distended and  tender to palpation, decreased BS.  Skin:  No Cyanosis, Normal Skin Turgor, No Skin Rash or Bruise.  Extremities:  Able to move all 4. 5/5 strength in each,  no effusions.  Well healed scar on distal RLE.  Data Review  CBC  Recent Labs Lab 02/08/15 0805 02/08/15 0816  WBC 38.8*  --   HGB  12.9 14.6  HCT 39.5 43.0  PLT 490*  --   MCV 85.9  --   MCH 28.0  --   MCHC 32.7  --   RDW 15.2  --     Chemistries   Recent Labs Lab 2014/08/09 0805 08-09-14 0816  NA 138 139  K 4.4 4.0  CL 100 101  CO2 28  --   GLUCOSE 172* 174*  BUN 33* 48*  CREATININE 1.44* 1.40*  CALCIUM 8.4  --   MG 2.3  --   AST 130*  --   ALT 137*  --   ALKPHOS 98  --   BILITOT 1.8*  --     Urinalysis    Component Value Date/Time   COLORURINE YELLOW 08-09-2014 0833   APPEARANCEUR CLOUDY* 2014-08-09 0833   LABSPEC 1.014 08-09-14 0833   PHURINE 5.5 08/09/14 0833   GLUCOSEU NEGATIVE August 09, 2014 0833   HGBUR SMALL* Aug 09, 2014 0833   BILIRUBINUR NEGATIVE Aug 09, 2014 0833   KETONESUR NEGATIVE 08/09/2014 0833   PROTEINUR NEGATIVE August 09, 2014 0833   UROBILINOGEN 0.2 2014/08/09 0833   NITRITE POSITIVE* August 09, 2014 0833   LEUKOCYTESUR MODERATE* 09-Aug-2014 0833    Imaging results:   Dg Chest Portable 1 View  August 09, 2014   CLINICAL DATA:  Shortness of breath.  EXAM: PORTABLE CHEST - 1 VIEW  COMPARISON:  06/09/2014; 11/30/2013; 11/04/2010  FINDINGS: Grossly unchanged enlarged cardiac silhouette and mediastinal contours with atherosclerotic plaque within the thoracic aorta. Pulmonary vasculature appears indistinct with cephalization of flow in consolidative heterogeneous opacities about the bilateral hila and bilateral lung bases, right greater than left. Trace bilateral effusions are suspected. No pneumothorax. Unchanged bones including old fracture involving the surgical neck of the left humerus.  IMPRESSION: Findings most suggestive of pulmonary edema, small bilateral effusions and associated perihilar and  bibasilar opacities, possibly asymmetric alveolar edema though underlying infection not excluded. Continued attention on follow-up is recommended.   Electronically Signed   By: Simonne Come M.D.   On: 08/09/14 08:31    My personal review of EKG: sinus tach   Assessment & Plan  Principal Problem:   Severe sepsis Active Problems:   UTI (lower urinary tract infection)   Chronic systolic heart failure   CKD (chronic kidney disease) stage 3, GFR 30-59 ml/min   DM (diabetes mellitus), type 2, uncontrolled, with renal complications   Aspiration pneumonia   Severe sepsis without septic shock   Severe sepsis with hypotension Pulse rate 126, respirations 38, Temp 101.6, BP 80/27, oxygen sats 64%, WBC 38. Lactic acid 3.75 Sepsis protocol initiated.  Will admit to step down.  Repeat Lactic Acid pending. Procalcitonin pending. Received fluid resuscitation.  Blood and Urine cultures pending. Likely potential sources include Aspiration PNA, Influenza and UTI. BP currently stable, but if it wanes again, she will likely need pressors. (the patient and family are ok with pressors)  Acute respiratory failure Hypoxic at 64% .  Remained hypoxic despite Bipap.  (currently 74%) Uncertain etiology.  Possibly secondary to Acute CHF exacerbation +/- Aspiration pna. +/- influenza  Currently on BiPap.  ABG does not look terribly bad.  Perhaps she may be able to come off of bipap soon. Treating for Aspiration pna with SLP eval, Aztreonam and Vanc. Influenza PCR ordered.  If positive please start Tamiflu. Will give lasix 40 mg IV after fluid resuscitation complete as CXR appears to show pulm edema. When patient is able to come off of Bipap - please call SLP for swallow eval - then hopefully convert back  to oral medications and intake.  UTI U/A appears positive.  In January patient had proteus.  After reviewing the sensitivities - it appears aztreonam would cover. Culture pending.  Acute on Chronic CHF -  Systolic 2D echo 1/61/09 showed an EF of 25 - 30%. CXR appears to show pulm edema.  She received 40 mg IV lasix in the ER.  Received Sepsis protocol fluid resuscitation, and will receive another 40 mg of IV lasix on the floor. Beta Blocker switched to IV as patient is NPO on Bipap.  HTN/Hypotension Will use IV metoprolol for now (with holding parameters). When able to take POs please restart BP meds as appropriate.  Diabetes Mellitus On Lantus at home. Will decrease dose of lantus for now and place on SSI - S with q 4 hour cbgs while NPO.  Left sided abdominal tenderness Patient denies constipation or vomiting. Will check abd xray.  Elevated LFTs New.  Likely due to acute sepsis. Monitor.  If these continue to worsen would consider abd u/s, or CT.  DVT Prophylaxis: lovenox.  AM Labs Ordered, also please review Full Orders  Family Communication:   Daughter, Kris Mouton dtr and son in law at bedside.  Code Status:  DNR  Condition:  guarded  Time spent in minutes : 60   Algis Downs,  PA-C on 08/15/2014 at 10:46 AM  Between 7am to 7pm - Pager - [phone]  After 7pm go to [email or web address] - password [email or web address]  And look for the night coverage person covering me after hours  [facility]

## 2014-07-30 NOTE — ED Notes (Signed)
Attempted to call report

## 2014-07-30 NOTE — ED Notes (Signed)
Pt. Arrived via [facility] with CPAP in place.  Pt. Was found by her son AT [facility] sob, cool and clammy, Paramedics arrived and found pt. In resp. Distress.  SAts were 64% on RA.  Pt. Was placed on CPAP and given 2 Nitro en route.  Sats increased to 84%,.  Pt. 's skin is cool, clammy and diphoretic.  Pt. Is responding to voice, follows commands. GCS 15

## 2014-07-30 NOTE — ED Notes (Signed)
Pt's gown had been cut by EMS.  Gown thrown away.

## 2014-07-30 NOTE — Progress Notes (Signed)
Attempted to put pt on [place].  Pt placed on 2L [place] by RT, pt stated she couldn't breathe so this RN increased O2 to 4 L [place]. Pt still c/o not being able to breathe so this RN placed pt back on Bi-pap. Pt comfortable and stating she could breathe better.

## 2014-07-30 NOTE — Progress Notes (Signed)
This note also relates to the following rows which could not be included: Pulse Rate - Cannot attach notes to unvalidated device data Resp - Cannot attach notes to unvalidated device data SpO2 - Cannot attach notes to unvalidated device data   Placed on 2L [place]

## 2014-07-30 NOTE — Progress Notes (Signed)
Pt taken off Bipap at this time, placed on 2L [place]. Tolerating well with no distress.

## 2014-07-30 NOTE — ED Provider Notes (Signed)
CSN: [id number]     Arrival date & time 07/31/2014  0801 History   First MD Initiated Contact with Patient 07/20/2014 [phone]     Chief Complaint  Patient presents with  . Respiratory Distress     (Consider location/radiation/quality/duration/timing/severity/associated sxs/prior Treatment) HPI Comments: 81 y/o female with hx of CHF - presents from [facility] (DNR) with c/o respiratory distress - found by sone in this condition this AM per EMS.  Sat's in the 60's, diaphoretic, severe hypertension and unable to talk due respiratory distress.  Pt is criticall ill on bipap by EMS with improvement of sat's to 80%  The history is provided by the patient.    Past Medical History  Diagnosis Date  . Cataract   . Heart murmur   . Hypertension   . Myocardial infarction   . Osteoporosis   . Diabetes mellitus without complication     TYPE 2  . Arthritis     IN [facility]   Past Surgical History  Procedure Laterality Date  . Appendectomy    . Eye surgery    . Abdominal hysterectomy     Family History  Problem Relation Age of Onset  . Heart disease Father   . Cancer Brother   . Heart disease Brother    History  Substance Use Topics  . Smoking status: Never Smoker   . Smokeless tobacco: Never Used  . Alcohol Use: No   OB History    No data available     Review of Systems  Unable to perform ROS     Allergies  Lotensin; Penicillins; Procaine; and Sulfa antibiotics  Home Medications   Prior to Admission medications   Medication Sig Start Date End Date Taking? Authorizing Provider  acetaminophen (TYLENOL) 325 MG tablet Take 2 tablets (650 mg total) by mouth every 6 (six) hours as needed for mild pain (or Fever >/= 101). 06/13/14   Christiane Ha, MD  Alpha-Lipoic Acid 600 MG CAPS Take 600 mg by mouth daily.     Historical Provider, MD  aspirin 81 MG tablet Take 81 mg by mouth daily.    Historical Provider, MD  carvedilol (COREG) 3.125 MG tablet Take 1 tablet  (3.125 mg total) by mouth 2 (two) times daily with a meal. 12/03/13   Shanker Levora Dredge, MD  cephALEXin (KEFLEX) 500 MG capsule Take 1 capsule (500 mg total) by mouth every 12 (twelve) hours. Through 06/14/14, then stop 06/13/14   Christiane Ha, MD  Cholecalciferol 2000 UNITS TABS Take 2,000 Units by mouth daily.    Historical Provider, MD  clopidogrel (PLAVIX) 75 MG tablet Take 75 mg by mouth daily.    Historical Provider, MD  DULoxetine (CYMBALTA) 60 MG capsule Take 60 mg by mouth daily.    Historical Provider, MD  fish oil-omega-3 fatty acids 1000 MG capsule Take 1 g by mouth daily.     Historical Provider, MD  furosemide (LASIX) 20 MG tablet Take 1 tablet (20 mg total) by mouth every other day. 06/13/14   Christiane Ha, MD  insulin glargine (LANTUS) 100 UNIT/ML injection Inject 0.3 mLs (30 Units total) into the skin daily. 06/13/14   Christiane Ha, MD  losartan (COZAAR) 25 MG tablet Take 1 tablet (25 mg total) by mouth daily. Hold for SBP below 100 06/13/14   Christiane Ha, MD  Multiple Vitamins-Minerals (MULTIVITAMIN WITH MINERALS) tablet Take 1 tablet by mouth daily.    Historical Provider, MD  nitroGLYCERIN (NITROSTAT)  0.4 MG SL tablet Place 0.4 mg under the tongue every 5 (five) minutes as needed for chest pain.    Historical Provider, MD  pravastatin (PRAVACHOL) 20 MG tablet Take 20 mg by mouth daily.    Historical Provider, MD  sitaGLIPtin (JANUVIA) 100 MG tablet Take 100 mg by mouth daily.    Historical Provider, MD   BP 179/57 mmHg  Pulse 126  Temp(Src) 101.6 F (38.7 C) (Core (Comment))  Resp 30  Ht  (1.651 m)  Wt 158 lb (71.668 kg)  BMI 26.29 kg/m2  SpO2 64% Physical Exam  Constitutional: She appears distressed.  HENT:  Head: Normocephalic and atraumatic.  Mouth/Throat: Oropharynx is clear and moist. No oropharyngeal exudate.  Eyes: Conjunctivae and EOM are normal. Pupils are equal, round, and reactive to light. Right eye exhibits no discharge. Left eye  exhibits no discharge. No scleral icterus.  Neck: Normal range of motion. Neck supple. No JVD present. No thyromegaly present.  Cardiovascular: Regular rhythm, normal heart sounds and intact distal pulses.  Exam reveals no gallop and no friction rub.   No murmur heard. Severe tachycardia, JVD and peripheral edema.  Pulmonary/Chest: She is in respiratory distress. She has no wheezes. She has rales.  Abdominal: Soft. Bowel sounds are normal. She exhibits no distension and no mass. There is no tenderness.  Musculoskeletal: Normal range of motion. She exhibits edema. She exhibits no tenderness.  Lymphadenopathy:    She has no cervical adenopathy.  Neurological: She is alert. Coordination normal.  Skin: Skin is warm and dry. No rash noted. No erythema.  Psychiatric: She has a normal mood and affect. Her behavior is normal.  Nursing note and vitals reviewed.   ED Course  Procedures (including critical care time) Labs Review Labs Reviewed  CBC - Abnormal; Notable for the following:    WBC 38.8 (*)    Platelets 490 (*)    All other components within normal limits  COMPREHENSIVE METABOLIC PANEL - Abnormal; Notable for the following:    Glucose, Bld 172 (*)    BUN 33 (*)    Creatinine, Ser 1.44 (*)    Albumin 2.5 (*)    AST 130 (*)    ALT 137 (*)    Total Bilirubin 1.8 (*)    GFR calc non Af Amer 31 (*)    GFR calc Af Amer 36 (*)    All other components within normal limits  I-STAT CHEM 8, ED - Abnormal; Notable for the following:    BUN 48 (*)    Creatinine, Ser 1.40 (*)    Glucose, Bld 174 (*)    Calcium, Ion 1.11 (*)    All other components within normal limits  CULTURE, BLOOD (ROUTINE X 2)  CULTURE, BLOOD (ROUTINE X 2)  URINE CULTURE  MAGNESIUM  APTT  BRAIN NATRIURETIC PEPTIDE  PROTIME-INR  URINALYSIS, ROUTINE W REFLEX MICROSCOPIC  INFLUENZA PANEL BY PCR (TYPE A & B, H1N1)  I-STAT TROPOININ, ED  I-STAT CG4 LACTIC ACID, ED    Imaging Review Dg Chest Portable 1  View  08/03/2014   CLINICAL DATA:  Shortness of breath.  EXAM: PORTABLE CHEST - 1 VIEW  COMPARISON:  06/09/2014; 11/30/2013; 11/04/2010  FINDINGS: Grossly unchanged enlarged cardiac silhouette and mediastinal contours with atherosclerotic plaque within the thoracic aorta. Pulmonary vasculature appears indistinct with cephalization of flow in consolidative heterogeneous opacities about the bilateral hila and bilateral lung bases, right greater than left. Trace bilateral effusions are suspected. No pneumothorax. Unchanged bones including old  fracture involving the surgical neck of the left humerus.  IMPRESSION: Findings most suggestive of pulmonary edema, small bilateral effusions and associated perihilar and bibasilar opacities, possibly asymmetric alveolar edema though underlying infection not excluded. Continued attention on follow-up is recommended.   Electronically Signed   By: Simonne ComeJohn  Watts M.D.   On: 07/22/2014 08:31     EKG Interpretation   Date/Time:  Saturday July 30 2014 08:03:17 EST Ventricular Rate:  129 PR Interval:  190 QRS Duration: 100 QT Interval:  335 QTC Calculation: 491 R Axis:   -66 Text Interpretation:  Age not entered, assumed to be  81 years old for  purpose of ECG interpretation Sinus tachycardia Anterior infarct, old  Since last tracing rate faster Abnormal ekg Confirmed by Hyacinth MeekerMILLER  MD, Devell Parkerson  [phone]([id number]) on 07/28/2014 8:13:17 AM      MDM   Final diagnoses:  Sepsis, due to unspecified organism  HCAP (healthcare-associated pneumonia)    The patient is in acute respiratory distress, she has severe hypertension, severe hypoxia, severe respiratory distress to the point where she is unable to speak, she is still following commands, I have reviewed her EMS paperwork including her medication list, her DO NOT RESUSCITATE request and have established a treatment plan with the nursing team including ongoing BiPAP, Lasix, nitroglycerin drip for severe hypertension and congestive  heart failure. The patient is critically ill. There is no family at this time, the son is in route to the hospital.  The patient has a white blood cell count of over 38,000, fever, tachycardia and tachypnea with persistent hypoxia. She does appear to be in septic shock, lactic acid is 4, she has been ordered sepsis protocol 30 mL/kg fluids as well as broad-spectrum antibiotics to cover healthcare associated pneumonia and sepsis. Her vital signs remain unstable, I discussed her care with family who again restates that she is DO NOT RESUSCITATE involving intubation or heroic measures but do agree with BiPAP and antibiotics. This was discussed with the hospitalist who will admit. Stepdown unit ordered.  CRITICAL CARE Performed by: Vida RollerMILLER,Laterica Matarazzo D Total critical care time: 35 Critical care time was exclusive of separately billable procedures and treating other patients. Critical care was necessary to treat or prevent imminent or life-threatening deterioration. Critical care was time spent personally by me on the following activities: development of treatment plan with patient and/or surrogate as well as nursing, discussions with consultants, evaluation of patient's response to treatment, examination of patient, obtaining history from patient or surrogate, ordering and performing treatments and interventions, ordering and review of laboratory studies, ordering and review of radiographic studies, pulse oximetry and re-evaluation of patient's condition.  Meds given in ED:  Medications  nitroGLYCERIN 50 mg in dextrose 5 % 250 mL (0.2 mg/mL) infusion (not administered)  sodium chloride 0.9 % bolus 1,000 mL (1,000 mLs Intravenous New Bag/Given 08/06/2014 0915)    Followed by  sodium chloride 0.9 % bolus 500 mL (not administered)  aztreonam (AZACTAM) 2 g in dextrose 5 % 50 mL IVPB (not administered)  vancomycin (VANCOCIN) 1,500 mg in sodium chloride 0.9 % 500 mL IVPB (not administered)  vancomycin (VANCOCIN)  IVPB 1000 mg/200 mL premix (not administered)  aztreonam (AZACTAM) 1 g in dextrose 5 % 50 mL IVPB (not administered)  furosemide (LASIX) injection 40 mg (40 mg Intravenous Given 08/05/2014 0911)  sodium chloride 0.9 % bolus 1,000 mL (1,000 mLs Intravenous New Bag/Given 08/05/2014 0911)    New Prescriptions   No medications on file  Eber Hong, MD 08/09/14 [phone]

## 2014-07-30 NOTE — Progress Notes (Signed)
ANTIBIOTIC CONSULT NOTE - INITIAL  Pharmacy Consult for vancomycin + aztreonam Indication: rule out pneumonia and rule out sepsis  Allergies  Allergen Reactions  . Lotensin [Benazepril Hcl] Other (See Comments)    Other   . Penicillins Itching and Rash  . Procaine Other (See Comments)    unknown  . Sulfa Antibiotics Rash    Patient Measurements:   Adjusted Body Weight:   Vital Signs: Temp: 101.6 F (38.7 C) (03/12 0842) Temp Source: Core (Comment) (03/12 0842) BP: 179/57 mmHg (03/12 0816) Pulse Rate: 126 (03/12 0816) Intake/Output from previous day:   Intake/Output from this shift:    Labs:  Recent Labs  08/13/2014 0805 07/21/2014 0816  WBC 38.8*  --   HGB 12.9 14.6  PLT 490*  --   CREATININE  --  1.40*   CrCl cannot be calculated (Unknown ideal weight.). No results for input(s): VANCOTROUGH, VANCOPEAK, VANCORANDOM, GENTTROUGH, GENTPEAK, GENTRANDOM, TOBRATROUGH, TOBRAPEAK, TOBRARND, AMIKACINPEAK, AMIKACINTROU, AMIKACIN in the last 72 hours.   Microbiology: No results found for this or any previous visit (from the past 720 hour(s)).  Medical History: Past Medical History  Diagnosis Date  . Cataract   . Heart murmur   . Hypertension   . Myocardial infarction   . Osteoporosis   . Diabetes mellitus without complication     TYPE 2  . Arthritis     IN FINGERS & KNEE    Medications:  Anti-infectives    Start     Dose/Rate Route Frequency Ordered Stop   07/31/14 1000  vancomycin (VANCOCIN) IVPB 1000 mg/200 mL premix     1,000 mg 200 mL/hr over 60 Minutes Intravenous Every 24 hours 08/13/2014 0900     07/27/2014 1800  aztreonam (AZACTAM) 1 g in dextrose 5 % 50 mL IVPB     1 g 100 mL/hr over 30 Minutes Intravenous Every 8 hours 07/25/2014 0900     08/10/2014 0900  aztreonam (AZACTAM) 2 g in dextrose 5 % 50 mL IVPB     2 g 100 mL/hr over 30 Minutes Intravenous STAT 08/13/2014 0848 07/31/14 0900   07/22/2014 0900  vancomycin (VANCOCIN) IVPB 1000 mg/200 mL premix  Status:   Discontinued     1,000 mg 200 mL/hr over 60 Minutes Intravenous  Once 08/10/2014 0848 08/11/2014 0854   08/10/2014 0900  vancomycin (VANCOCIN) 1,500 mg in sodium chloride 0.9 % 500 mL IVPB     1,500 mg 250 mL/hr over 120 Minutes Intravenous STAT 08/18/2014 0854 07/31/14 0900     Assessment: 89 yof presented to the ED from a nursing home with respiratory distress. Pt is febrile with a Tmax of 101.6 and significant leukocytosis of 38.8. Scr is 1.4 via i-stat and cultures are pending. To start broad-spectrum antibiotics with vancomycin + aztreonam (PCN allergy - has tolerated cephalosporins in the past) for treatment of pneumonia and possible sepsis.  Vanc 3/12>> Aztreo 3/12>>   Goal of Therapy:  Vancomycin trough level 15-20 mcg/ml  Plan:  - Vancomycin 1500mg  IV x 1 then 1gm IV Q24H - Aztreonam 2gm IV x 1 then 1gm IV Q8H - F/u renal fxn, C&S, clinical status and [facility]  Masao Junker, Drake LeachRachel Lynn 08/11/2014,9:00 AM

## 2014-07-30 NOTE — ED Notes (Signed)
Pt's BP cuff changed.

## 2014-07-31 ENCOUNTER — Inpatient Hospital Stay (HOSPITAL_COMMUNITY): Payer: Medicare Other

## 2014-07-31 DIAGNOSIS — E1129 Type 2 diabetes mellitus with other diabetic kidney complication: Secondary | ICD-10-CM

## 2014-07-31 DIAGNOSIS — I5022 Chronic systolic (congestive) heart failure: Secondary | ICD-10-CM

## 2014-07-31 DIAGNOSIS — N183 Chronic kidney disease, stage 3 (moderate): Secondary | ICD-10-CM

## 2014-07-31 DIAGNOSIS — E1165 Type 2 diabetes mellitus with hyperglycemia: Secondary | ICD-10-CM

## 2014-07-31 LAB — COMPREHENSIVE METABOLIC PANEL
ALK PHOS: 74 U/L (ref 39–117)
ALT: 101 U/L — AB (ref 0–35)
ANION GAP: 11 (ref 5–15)
AST: 59 U/L — AB (ref 0–37)
Albumin: 2.2 g/dL — ABNORMAL LOW (ref 3.5–5.2)
BUN: 27 mg/dL — AB (ref 6–23)
CALCIUM: 7.8 mg/dL — AB (ref 8.4–10.5)
CHLORIDE: 109 mmol/L (ref 96–112)
CO2: 22 mmol/L (ref 19–32)
CREATININE: 1.22 mg/dL — AB (ref 0.50–1.10)
GFR calc Af Amer: 44 mL/min — ABNORMAL LOW (ref >90)
GFR calc non Af Amer: 38 mL/min — ABNORMAL LOW (ref >90)
GLUCOSE: 110 mg/dL — AB (ref 70–99)
Potassium: 3.1 mmol/L — ABNORMAL LOW (ref 3.5–5.1)
Sodium: 142 mmol/L (ref 135–145)
Total Bilirubin: 0.6 mg/dL (ref 0.3–1.2)
Total Protein: 5.5 g/dL — ABNORMAL LOW (ref 6.0–8.3)

## 2014-07-31 LAB — BLOOD GAS, ARTERIAL
Acid-Base Excess: 1.6 mmol/L (ref 0.0–2.0)
Bicarbonate: 25.1 mEq/L — ABNORMAL HIGH (ref 20.0–24.0)
DRAWN BY: 10552
O2 Content: 3 L/min
O2 Saturation: 92.2 %
PH ART: 7.468 — AB (ref 7.350–7.450)
PO2 ART: 57.1 mmHg — AB (ref 80.0–100.0)
Patient temperature: 98.6
TCO2: 26.1 mmol/L (ref 0–100)
pCO2 arterial: 35 mmHg (ref 35.0–45.0)

## 2014-07-31 LAB — CBC
HEMATOCRIT: 32.4 % — AB (ref 36.0–46.0)
HEMOGLOBIN: 10.7 g/dL — AB (ref 12.0–15.0)
MCH: 28.5 pg (ref 26.0–34.0)
MCHC: 33 g/dL (ref 30.0–36.0)
MCV: 86.2 fL (ref 78.0–100.0)
Platelets: 310 10*3/uL (ref 150–400)
RBC: 3.76 MIL/uL — ABNORMAL LOW (ref 3.87–5.11)
RDW: 15.3 % (ref 11.5–15.5)
WBC: 19.7 10*3/uL — AB (ref 4.0–10.5)

## 2014-07-31 MED ORDER — SODIUM CHLORIDE 0.9 % IV SOLN
1.0000 mg/h | INTRAVENOUS | Status: DC
  Administered 2014-07-31: 1 mg/h via INTRAVENOUS
  Administered 2014-07-31: 4 mg/h via INTRAVENOUS
  Administered 2014-07-31: 2 mg/h via INTRAVENOUS
  Administered 2014-08-01: 5 mg/h via INTRAVENOUS
  Filled 2014-07-31: qty 10

## 2014-07-31 MED ORDER — SCOPOLAMINE 1 MG/3DAYS TD PT72
1.0000 | MEDICATED_PATCH | TRANSDERMAL | Status: DC
  Administered 2014-07-31: 1.5 mg via TRANSDERMAL
  Filled 2014-07-31: qty 1

## 2014-07-31 MED ORDER — POTASSIUM CHLORIDE 10 MEQ/100ML IV SOLN
10.0000 meq | INTRAVENOUS | Status: AC
  Administered 2014-07-31 (×4): 10 meq via INTRAVENOUS
  Filled 2014-07-31 (×4): qty 100

## 2014-07-31 MED ORDER — MORPHINE SULFATE 2 MG/ML IJ SOLN
1.0000 mg | INTRAMUSCULAR | Status: AC
  Administered 2014-07-31: 1 mg via INTRAVENOUS

## 2014-07-31 MED ORDER — ASPIRIN 325 MG PO TABS
325.0000 mg | ORAL_TABLET | Freq: Every day | ORAL | Status: DC
  Administered 2014-07-31: 325 mg via ORAL
  Filled 2014-07-31: qty 1

## 2014-07-31 MED ORDER — ATROPINE SULFATE 1 % OP SOLN
2.0000 [drp] | Freq: Four times a day (QID) | OPHTHALMIC | Status: DC
  Administered 2014-07-31 – 2014-08-01 (×5): 2 [drp] via SUBLINGUAL
  Filled 2014-07-31: qty 2

## 2014-07-31 MED ORDER — MORPHINE SULFATE 2 MG/ML IJ SOLN
2.0000 mg | INTRAMUSCULAR | Status: DC | PRN
  Administered 2014-07-31: 2 mg via INTRAVENOUS
  Filled 2014-07-31: qty 1

## 2014-07-31 MED ORDER — LORAZEPAM 2 MG/ML IJ SOLN
1.0000 mg | INTRAMUSCULAR | Status: DC | PRN
  Administered 2014-07-31: 1 mg via INTRAVENOUS

## 2014-07-31 MED ORDER — GLYCOPYRROLATE 0.2 MG/ML IJ SOLN
0.1000 mg | Freq: Four times a day (QID) | INTRAMUSCULAR | Status: DC | PRN
  Filled 2014-07-31: qty 0.5

## 2014-07-31 NOTE — Progress Notes (Signed)
Rec'd TC from CNA stating pt having difficutly breathing.  This RN along with RT assessed pt.  Pt's O2 sat dropping into the low 80's.  Put pt on venti-mask which brought pt's O2 sats into the mid to high 80's. This RN gave pt a dose of ativan and morphine. Pged MD to notify of condition.  Pt making states of "I just want die.", "Please just knock me out."  Pt's dtr in room.  Upon MD entering room gave verbal for additional dose of ativan and morphine, given. Pt seems to be calming down and looks more comfortable at this time.

## 2014-07-31 NOTE — Evaluation (Signed)
Clinical/Bedside Swallow Evaluation Patient Details  Name: Leslie Delgado MRN: [id number] Date of Birth: Jun 22, 1921  Today's Date: 07/31/2014 Time: SLP Start Time (ACUTE ONLY): 1015 SLP Stop Time (ACUTE ONLY): 1034 SLP Time Calculation (min) (ACUTE ONLY): 19 min  Past Medical History:  Past Medical History  Diagnosis Date  . Cataract   . Heart murmur   . Hypertension   . Myocardial infarction   . Osteoporosis   . Diabetes mellitus without complication     TYPE 2  . Arthritis     IN FINGERS & KNEE   Past Surgical History:  Past Surgical History  Procedure Laterality Date  . Appendectomy    . Eye surgery    . Abdominal hysterectomy     HPI:  81 y.o. female, with a past medical history significant for diabetes mellitus, systolic heart failure, history of MI, hypertension and arthritis admitted with sepsis, acute respiratory failure, UTI.  Requiring BiPap; transitioned to [place] and tolerating well.     Assessment / Plan / Recommendation Clinical Impression  Pt presents with normal oropharyngeal swallow marked by sufficient mastication, consistent exhalation post-swallow, no s/s of aspiration.  RR intermittently in 30s, raising concerns for appropriate coordination of ventilation/swallow.  Cautioned pt/family re: pacing and taking rest breaks while eating.  Appears to be protecting airway well when eating/drinking.  SLP to sign off.  Rec regular diet, thin liquids; meds with liquid.     Aspiration Risk  Mild    Diet Recommendation Regular;Thin liquid   Liquid Administration via: Straw;Cup Medication Administration: Whole meds with liquid Supervision: Patient able to self feed    Other  Recommendations Oral Care Recommendations: Oral care BID   Follow Up Recommendations  None        Swallow Study Prior Functional Status       General Date of Onset: March 19, 2015 HPI: 81 y.o. female, with a past medical history significant for diabetes mellitus, systolic heart failure, history  of MI, hypertension and arthritis admitted with sepsis, acute respiratory failure, UTI.  Requiring BiPap; transitioned to [place] and tolerating well.   Type of Study: Bedside swallow evaluation Previous Swallow Assessment: none per records Diet Prior to this Study: NPO Temperature Spikes Noted: No Respiratory Status: Nasal cannula (bil pulm infiltrates R>L cxr 2014-09-02) History of Recent Intubation: No Behavior/Cognition: Alert;Cooperative Oral Cavity - Dentition: Adequate natural dentition Self-Feeding Abilities: Able to feed self Patient Positioning: Upright in bed Baseline Vocal Quality: Clear Volitional Cough: Strong Volitional Swallow: Able to elicit    Oral/Motor/Sensory Function Overall Oral Motor/Sensory Function: Appears within functional limits for tasks assessed   Ice Chips Ice chips: Within functional limits   Thin Liquid Thin Liquid: Within functional limits Presentation: Straw    Nectar Thick Nectar Thick Liquid: Not tested   Honey Thick Honey Thick Liquid: Not tested   Puree Puree: Within functional limits Presentation: Self Fed;Spoon   Solid  Leslie Delgado, [place]MA CCC/SLP Pager [phone]     Solid: Within functional limits Presentation: Self Fed       Leslie Delgado, Leslie Delgado 07/31/2014,10:38 AM

## 2014-07-31 NOTE — Progress Notes (Addendum)
[facility] PROGRESS NOTE  Signe ColtRachel B Shakoor WUX:[id number] DOB: December 20, 1921 DOA: 08/26/14 PCP: Juline PatchPANG,RICHARD, MD  Assessment/Plan: 1. ACute hypoxic resp failure 2. Sepsis 3. Chronic systolic CHF EF of 25% 4. DM 5. Afib 6. CKD 3 7. Advanced age  Pt seen and examined, daughter at bedside, deteriorated again from a respiratory standpoint  Tachypneic, tachycardic and hypoxic upon re-evaluation  I discussed with patient and daughter, regarding Goals of therapy Confirmed DNR, patient declines BIPAP at this time and requests measures or comfort,  Daughter made aware of Impending Demise -Start morphine gtt, ativan PRN -Expect quick demise  Code Status: DNR Family Communication: d/w daughter at bedside Disposition Plan: Expect quick demise   Antibiotics:  Vanc/Aztreonam  HPI/Subjective: Feels better, wants to be off BIPAP  Objective: Filed Vitals:   07/31/14 0530  BP: 119/69  Pulse: 114  Temp:   Resp: 27    Intake/Output Summary (Last 24 hours) at 07/31/14 0733 Last data filed at 07/31/14 0400  Gross per 24 hour  Intake   2559 ml  Output   1375 ml  Net   1184 ml   Filed Weights   05-24-14 0901 05-24-14 1200 07/31/14 0440  Weight: 71.668 kg (158 lb) 71.6 kg (157 lb 13.6 oz) 78.1 kg (172 lb 2.9 oz)    Exam:   General:  Tachypneic, in distress, now with ventimask on  Cardiovascular: S1S2/RRR  Respiratory: poor air movement bilaterally  Abdomen: soft, Nt, BS present  Musculoskeletal: no edema c/c   Data Reviewed: Basic Metabolic Panel:  Recent Labs Lab 05-24-14 0805 05-24-14 0816 07/31/14 0451  NA 138 139 142  K 4.4 4.0 3.1*  CL 100 101 109  CO2 28  --  22  GLUCOSE 172* 174* 110*  BUN 33* 48* 27*  CREATININE 1.44* 1.40* 1.22*  CALCIUM 8.4  --  7.8*  MG 2.3  --   --    Liver Function Tests:  Recent Labs Lab 05-24-14 0805 07/31/14 0451  AST 130* 59*  ALT 137* 101*  ALKPHOS 98 74  BILITOT 1.8* 0.6  PROT 6.3 5.5*  ALBUMIN 2.5* 2.2*    No results for input(s): LIPASE, AMYLASE in the last 168 hours. No results for input(s): AMMONIA in the last 168 hours. CBC:  Recent Labs Lab 05-24-14 0805 05-24-14 0816 07/31/14 0451  WBC 38.8*  --  19.7*  HGB 12.9 14.6 10.7*  HCT 39.5 43.0 32.4*  MCV 85.9  --  86.2  PLT 490*  --  310   Cardiac Enzymes: No results for input(s): CKTOTAL, CKMB, CKMBINDEX, TROPONINI in the last 168 hours. BNP (last 3 results)  Recent Labs  05-24-14 0805  BNP 449.2*    ProBNP (last 3 results) No results for input(s): PROBNP in the last 8760 hours.  CBG: No results for input(s): GLUCAP in the last 168 hours.  Recent Results (from the past 240 hour(s))  MRSA PCR Screening     Status: None   Collection Time: 05-24-14 11:53 AM  Result Value Ref Range Status   MRSA by PCR NEGATIVE NEGATIVE Final    Comment:        The GeneXpert MRSA Assay (FDA approved for NASAL specimens only), is one component of a comprehensive MRSA colonization surveillance program. It is not intended to diagnose MRSA infection nor to guide or monitor treatment for MRSA infections.      Studies: Dg Chest Portable 1 View  08/26/14   CLINICAL DATA:  Shortness of breath.  EXAM: PORTABLE CHEST -  1 VIEW  COMPARISON:  06/09/2014; 11/30/2013; 11/04/2010  FINDINGS: Grossly unchanged enlarged cardiac silhouette and mediastinal contours with atherosclerotic plaque within the thoracic aorta. Pulmonary vasculature appears indistinct with cephalization of flow in consolidative heterogeneous opacities about the bilateral hila and bilateral lung bases, right greater than left. Trace bilateral effusions are suspected. No pneumothorax. Unchanged bones including old fracture involving the surgical neck of the left humerus.  IMPRESSION: Findings most suggestive of pulmonary edema, small bilateral effusions and associated perihilar and bibasilar opacities, possibly asymmetric alveolar edema though underlying infection not excluded.  Continued attention on follow-up is recommended.   Electronically Signed   By: Simonne Come M.D.   On: 08/13/2014 08:31   Dg Abd Portable 1v  08/14/2014   CLINICAL DATA:  sob, cool and clammy, Paramedics arrived and found pt. In resp. Distress. SAts were 64% on RA. Pt. Was placed on CPAP and given 2 Nitro en route. Sats increased to 84%,. Pt. 's skin is cool, clammy and diphoretic. Pt. Is responding to voice, follows commands. Code sepsis  EXAM: PORTABLE ABDOMEN - 1 VIEW  COMPARISON:  None.  FINDINGS: The bowel gas pattern is normal. No abnormal abdominal calcifications. Changes of kyphoplasty/vertebroplasty L1, L3, and L5.  IMPRESSION: 1. Normal bowel gas pattern.   Electronically Signed   By: Corlis Leak M.D.   On: 07/31/2014 11:38    Scheduled Meds: . antiseptic oral rinse  7 mL Mouth Rinse q12n4p  . aspirin  150 mg Rectal Daily  . aztreonam  1 g Intravenous Q8H  . chlorhexidine  15 mL Mouth Rinse BID  . enoxaparin (LOVENOX) injection  30 mg Subcutaneous Q24H  . furosemide  40 mg Intravenous BID  . insulin aspart  0-9 Units Subcutaneous 6 times per day  . insulin glargine  10 Units Subcutaneous Daily  . levalbuterol  0.63 mg Nebulization Q6H  . metoprolol  5 mg Intravenous 4 times per day  . potassium chloride  10 mEq Intravenous Q1 Hr x 4  . sodium chloride  3 mL Intravenous Q12H  . sodium chloride  3 mL Intravenous Q12H  . vancomycin  1,000 mg Intravenous Q24H   Continuous Infusions:  Antibiotics Given (last 72 hours)    Date/Time Action Medication Dose Rate   08/07/2014 1635 Given   oseltamivir (TAMIFLU) capsule 75 mg 75 mg    07/25/2014 1700 Given   aztreonam (AZACTAM) 1 g in dextrose 5 % 50 mL IVPB 1 g 100 mL/hr   07/31/14 0159 Given   aztreonam (AZACTAM) 1 g in dextrose 5 % 50 mL IVPB 1 g 100 mL/hr      Principal Problem:   Severe sepsis Active Problems:   UTI (lower urinary tract infection)   Chronic systolic heart failure   CKD (chronic kidney disease) stage 3, GFR 30-59  ml/min   DM (diabetes mellitus), type 2, uncontrolled, with renal complications   Aspiration pneumonia   Severe sepsis without septic shock   Systolic CHF, acute on chronic    Time spent:    [facility]  [facility] Pager [phone]. If 7PM-7AM, please contact night-coverage at [email or web address], password [facility] 07/31/2014, 7:33 AM  LOS: 1 day

## 2014-07-31 NOTE — Progress Notes (Signed)
Dr Jomarie LongsJoseph in to see and evaluate pt.Updated on pt's status. Made aware that her Afib rates are down from 160's to 100-110. Pt is awake and alert and tolerated the BIPAP throughout the night. Although pt prefers to have it off. Breath sounds remain diminished LLL>R. New orders obtained. Respiratory paged for ABG. Will continue to monitor.

## 2014-07-31 NOTE — Progress Notes (Signed)
Pt developing terminal secretions.  MD notified. New orders rec'd.

## 2014-07-31 NOTE — Progress Notes (Signed)
Utilization Review Completed.   Ailish Prospero, RN, BSN Nurse Case Manager  

## 2014-07-31 NOTE — Progress Notes (Signed)
Rec'd pt responsive to painful stimuli only. Morphine drip continue  to infuse at 4 mg/hr. Vital signs obtain. Pt resting and looks comfortable. Family members present at bedside.

## 2014-07-31 NOTE — Progress Notes (Signed)
Morphine gtt started.  Titrated up over the next 20 min to 4 mg/hr for pt's comfort. Pt seems to be more comfortable.

## 2014-08-01 LAB — PROTIME-INR

## 2014-08-01 LAB — GLUCOSE, CAPILLARY
Glucose-Capillary: 115 mg/dL — ABNORMAL HIGH (ref 70–99)
Glucose-Capillary: 103 mg/dL — ABNORMAL HIGH (ref 70–99)

## 2014-08-01 LAB — URINE CULTURE

## 2014-08-01 LAB — APTT

## 2014-08-05 LAB — CULTURE, BLOOD (ROUTINE X 2)
CULTURE: NO GROWTH
Culture: NO GROWTH

## 2014-08-19 NOTE — Progress Notes (Signed)
Wasted about 150 ml of Morphine drip witnessed by Delman CheadlePatti Moss, RN

## 2014-08-19 NOTE — Progress Notes (Signed)
[facility] informed of death at this timed.

## 2014-08-19 NOTE — Progress Notes (Signed)
[facility] Pt will be coming  per bed and family aware. Pt comfort care on Morphine drip at 5 mg/hr Pt does bite on oral care sticks. No other response / turned every 2 hours. Attempted report twice to [facility] [place] first call at 12:30

## 2014-08-19 NOTE — Progress Notes (Signed)
Pt passed at 4:50pm. Pronounced by Delman CheadlePatti Tiffiny Worthy RN and Stasia CavalierZee RN. Pt was DNR and under palliative  care. Dr. Jomarie LongsJoseph notified. Family at bedside and expecting additional family to arrive.

## 2014-08-19 NOTE — Discharge Summary (Addendum)
Death Summary  [name] [id number] DOB: 06/17/1921 DOA: 06/12/2014  PCP: Juline PatchPANG,RICHARD, MD  Admit date: 06/12/2014 Date of Death: 08/16/2014  Final Diagnoses:  Principal Problem:   Severe sepsis   Acute Hypoxic respiratory failure   UTI (lower urinary tract infection)   Chronic systolic heart failure   CKD (chronic kidney disease) stage 3, GFR 30-59 ml/min   DM (diabetes mellitus), type 2, uncontrolled, with renal complications   Aspiration pneumonia   Severe sepsis without septic shock   Systolic CHF, acute on chronic   History of present illness:  Leslie HeirRachel Delgado is a 81 y.o. female, with a past medical history significant for diabetes mellitus, systolic heart failure, history of MI, hypertension and arthritis. She was sent to the emergency room after being found in respiratory distress at her assisted living facility. The patient was alert and put on a BiPAP so family assisted with history. According to the emergency department physician the patient was found to be very unexpectedly in respiratory distress this morning with oxygen sats in the 60s. She remained hypoxic even after being placed on BiPAP.  In the ER the patient's blood pressure has waxed and waned from a systolic of 80-179 now at 120. Temperature 101.6. Respirations are 38. Pulse rate 126. Her white count is 38.8 it was previously normal. Chest x-ray shows pulmonary edema, underlying infection not excluded. Urinalysis appears positive for infection. She was admitted to the stepdown unit on BiPAP, with antibiotics for aspiration pneumonia.   Hospital Course:  1. Acute hypoxic resp failure 2. Sepsis 3. Chronic systolic CHF EF of 25% 4. DM 5. Afib 6. CKD 3 7. Advanced age 89. Hypokalemia  She was admitted with Sepsis and respiratory failure and continued to decline despite Medical Rx. Upon worsening declined BIPAP again and confirmed DNR, she reported to me in the presence of her daughetr that she just wanted to  be comfortable and knew she wad dying. She was then started on a morphine drip and transferred to Palliative Unit, subsequently expired on 3/14   Time: 45min  Signed:  Andranik Jeune  [facility] 08/07/2014, 8:33 PM

## 2014-08-19 NOTE — Progress Notes (Signed)
Witnessed  Morphine waste 150ml with Warehouse managerZee RN

## 2014-08-19 NOTE — Progress Notes (Addendum)
Chaplain notified to visit with pt and family via her nurse.   Pt daughter, granddaughter, and family friend bedside. Chaplain facilitated story-telling and provided emotional/ spiritual support and prayed with family.   Family describe Leslie Delgado as a very cerebral woman. She has two bachelor degrees, one in mathematics and the other in Art History, the latter she achieved in her 7050s.   She is a quit witted woman and has been a constant joy to the family. She maintains classic practices in knitting and quilt-work, a skill and passion she passed on to the granddaughter present at bedside.  Family is experiencing anticipatory grief, yet they are supporting one another.   Family grateful for the visit. Very kind and affable.   Granddaughter who is bedside is also a newlywed.   Gala RomneyBrown, Reyanna Baley J, Chaplain 07/28/2014 1:53 PM Page if needed.

## 2014-08-19 NOTE — Progress Notes (Signed)
Nutrition Brief Note  Chart reviewed. Pt now transitioning to comfort care.  No further nutrition interventions warranted at this time.  Please re-consult as needed.   Juda Lajeunesse A. Rodolph Hagemann, RD, LDN, CDE Pager: [phone] After hours Pager: [phone]  

## 2014-08-19 NOTE — Progress Notes (Signed)
Pt transferred per bed to [address] room 19

## 2014-08-19 NOTE — Progress Notes (Signed)
[facility] PROGRESS NOTE  Leslie Delgado:[id number] DOB: 17-Apr-1922 DOA: 08/06/14 PCP: Juline Patch, MD  Assessment/Plan: 1. Acute hypoxic resp failure 2. Sepsis 3. Chronic systolic CHF EF of 25% 4. DM 5. Afib 6. CKD 3 7. Advanced age  Comfort Care on Morphine drip at /hr now transfer to Palliative care unit  Code Status: DNR Family Communication: d/w daughter at bedside Disposition Plan: Expect quick demise   Antibiotics:  Vanc/Aztreonam  HPI/Subjective: Remains comfortable and unresponsive on morphine gtt  Objective: Filed Vitals:   08/09/2014 1132  BP: 81/27  Pulse: 88  Temp: 98.1 F (36.7 C)  Resp: 12    Intake/Output Summary (Last 24 hours) at 07/25/2014 1217 Last data filed at 07/22/2014 8119  Gross per 24 hour  Intake      0 ml  Output     75 ml  Net    -75 ml   Filed Weights   08/06/2014 1200 07/31/14 0440 08/11/2014 0006  Weight: 71.6 kg (157 lb 13.6 oz) 78.1 kg (172 lb 2.9 oz) 77 kg (169 lb 12.1 oz)    Exam:   General:  unresponsive  Cardiovascular: S1S2/RRR  Respiratory: poor air movement bilaterally  Abdomen: soft, Nt, BS present  Musculoskeletal: no edema c/c   Data Reviewed: Basic Metabolic Panel:  Recent Labs Lab 2014-08-06 0805 2014/08/06 0816 07/31/14 0451  NA 138 139 142  K 4.4 4.0 3.1*  CL 100 101 109  CO2 28  --  22  GLUCOSE 172* 174* 110*  BUN 33* 48* 27*  CREATININE 1.44* 1.40* 1.22*  CALCIUM 8.4  --  7.8*  MG 2.3  --   --    Liver Function Tests:  Recent Labs Lab 08-06-14 0805 07/31/14 0451  AST 130* 59*  ALT 137* 101*  ALKPHOS 98 74  BILITOT 1.8* 0.6  PROT 6.3 5.5*  ALBUMIN 2.5* 2.2*   No results for input(s): LIPASE, AMYLASE in the last 168 hours. No results for input(s): AMMONIA in the last 168 hours. CBC:  Recent Labs Lab 08/06/2014 0805 Aug 06, 2014 0816 07/31/14 0451  WBC 38.8*  --  19.7*  HGB 12.9 14.6 10.7*  HCT 39.5 43.0 32.4*  MCV 85.9  --  86.2  PLT 490*  --  310   Cardiac  Enzymes: No results for input(s): CKTOTAL, CKMB, CKMBINDEX, TROPONINI in the last 168 hours. BNP (last 3 results)  Recent Labs  08/06/14 0805  BNP 449.2*    ProBNP (last 3 results) No results for input(s): PROBNP in the last 8760 hours.  CBG:  Recent Labs Lab 07/31/14 0320 07/31/14 0807  GLUCAP 115* 103*    Recent Results (from the past 240 hour(s))  Blood culture (routine x 2)     Status: None (Preliminary result)   Collection Time: 08-06-14  8:30 AM  Result Value Ref Range Status   Specimen Description BLOOD RIGHT ARM  Final   Special Requests BOTTLES DRAWN AEROBIC AND ANAEROBIC 5CC  Final   Culture   Final           BLOOD CULTURE RECEIVED NO GROWTH TO DATE CULTURE WILL BE HELD FOR 5 DAYS BEFORE ISSUING A FINAL NEGATIVE REPORT Performed at [facility]    Report Status PENDING  Incomplete  Urine culture     Status: None (Preliminary result)   Collection Time: Aug 06, 2014  8:33 AM  Result Value Ref Range Status   Specimen Description URINE, RANDOM  Final   Special Requests NONE  Final   Colony  Count   Final    >=100,000 COLONIES/ML [facility]    Culture   Final    ESCHERICHIA COLI Performed at [facility]    Report Status PENDING  Incomplete  Blood culture (routine x 2)     Status: None (Preliminary result)   Collection Time: May 15, 2015  8:37 AM  Result Value Ref Range Status   Specimen Description BLOOD RFOA  Final   Special Requests BOTTLES DRAWN AEROBIC AND ANAEROBIC 3CC  Final   Culture   Final           BLOOD CULTURE RECEIVED NO GROWTH TO DATE CULTURE WILL BE HELD FOR 5 DAYS BEFORE ISSUING A FINAL NEGATIVE REPORT Performed at [facility]    Report Status PENDING  Incomplete  MRSA PCR Screening     Status: None   Collection Time: May 15, 2015 11:53 AM  Result Value Ref Range Status   MRSA by PCR NEGATIVE NEGATIVE Final    Comment:        The GeneXpert MRSA Assay (FDA approved for NASAL specimens only), is one  component of a comprehensive MRSA colonization surveillance program. It is not intended to diagnose MRSA infection nor to guide or monitor treatment for MRSA infections.      Studies: Dg Chest 2 View  07/31/2014   CLINICAL DATA:  Hypoxia, hypertension, diabetes, prior MI, systolic CHF  EXAM: CHEST  2 VIEW  COMPARISON:  07/31/2014  FINDINGS: Enlargement of cardiac silhouette it  Atherosclerotic calcification aorta.  BILATERAL pulmonary infiltrates RIGHT greater than LEFT demonstrating slight improvement since the previous exam favor pulmonary edema.  Small bibasilar effusions and atelectasis.  No pneumothorax.  Bones demineralized.  IMPRESSION: Improved pulmonary edema.   Electronically Signed   By: Ulyses SouthwardMark  Boles M.D.   On: 07/31/2014 09:42   Portable Chest 1 View  07/31/2014   CLINICAL DATA:  Sepsis, respiratory failure, history hypertension, MI, diabetes  EXAM: PORTABLE CHEST - 1 VIEW  COMPARISON:  Portable exam 0546 hr compared to July 19, 2014  FINDINGS: Borderline enlargement of cardiac silhouette with LEFT ventricular prominence.  Atherosclerotic calcification aorta.  BILATERAL airspace infiltrates greater on RIGHT.  Question small LEFT pleural effusion.  No Pneumothorax.  Bones demineralized.  IMPRESSION: Persistent asymmetric pulmonary infiltrates greater on RIGHT question edema versus infection.   Electronically Signed   By: Ulyses SouthwardMark  Boles M.D.   On: 07/31/2014 07:56    Scheduled Meds: . atropine  2 drop Sublingual QID  . chlorhexidine  15 mL Mouth Rinse BID  . scopolamine  1 patch Transdermal Q72H  . sodium chloride  3 mL Intravenous Q12H  . sodium chloride  3 mL Intravenous Q12H   Continuous Infusions: . morphine 5 mg/hr (07/31/2014 0615)   Antibiotics Given (last 72 hours)    Date/Time Action Medication Dose Rate   May 15, 2015 1635 Given   oseltamivir (TAMIFLU) capsule 75 mg 75 mg    May 15, 2015 1700 Given   aztreonam (AZACTAM) 1 g in dextrose 5 % 50 mL IVPB 1 g 100 mL/hr   07/31/14 0159  Given   aztreonam (AZACTAM) 1 g in dextrose 5 % 50 mL IVPB 1 g 100 mL/hr   07/31/14 1000 Given   vancomycin (VANCOCIN) IVPB 1000 mg/200 mL premix 1,000 mg 200 mL/hr      Principal Problem:   Severe sepsis Active Problems:   UTI (lower urinary tract infection)   Chronic systolic heart failure   CKD (chronic kidney disease) stage 3, GFR 30-59 ml/min  DM (diabetes mellitus), type 2, uncontrolled, with renal complications   Aspiration pneumonia   Severe sepsis without septic shock   Systolic CHF, acute on chronic    Time spent:    [facility]  [facility] Pager [phone]. If 7PM-7AM, please contact night-coverage at [email or web address], password [facility] 08/07/2014, 12:17 PM  LOS: 2 days

## 2014-08-19 DEATH — deceased
